# Patient Record
Sex: Female | Born: 1965 | Race: Black or African American | Hispanic: No | Marital: Single | Smoking: Never smoker
Health system: Southern US, Community
[De-identification: ages and names within clinical notes are randomized; demographics above are authoritative.]

## PROBLEM LIST (undated history)

## (undated) DIAGNOSIS — F32A Depression, unspecified: Secondary | ICD-10-CM

## (undated) DIAGNOSIS — F419 Anxiety disorder, unspecified: Secondary | ICD-10-CM

## (undated) DIAGNOSIS — F329 Major depressive disorder, single episode, unspecified: Secondary | ICD-10-CM

## (undated) DIAGNOSIS — K219 Gastro-esophageal reflux disease without esophagitis: Secondary | ICD-10-CM

## (undated) DIAGNOSIS — Z8041 Family history of malignant neoplasm of ovary: Secondary | ICD-10-CM

## (undated) DIAGNOSIS — J45909 Unspecified asthma, uncomplicated: Secondary | ICD-10-CM

## (undated) DIAGNOSIS — I10 Essential (primary) hypertension: Secondary | ICD-10-CM

## (undated) DIAGNOSIS — T7840XA Allergy, unspecified, initial encounter: Secondary | ICD-10-CM

## (undated) DIAGNOSIS — D649 Anemia, unspecified: Secondary | ICD-10-CM

## (undated) HISTORY — DX: Allergy, unspecified, initial encounter: T78.40XA

## (undated) HISTORY — DX: Family history of malignant neoplasm of ovary: Z80.41

## (undated) HISTORY — PX: BREAST SURGERY: SHX581

## (undated) HISTORY — DX: Anemia, unspecified: D64.9

## (undated) HISTORY — DX: Depression, unspecified: F32.A

## (undated) HISTORY — DX: Unspecified asthma, uncomplicated: J45.909

---
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
## 2019-09-22 ENCOUNTER — Emergency Department
Admission: EM | Admit: 2019-09-22 | Discharge: 2019-09-22 | Disposition: A | Discharge status: Home / Self Care | Payer: 59 | Source: Home / Self Care | Attending: Family Medicine | Admitting: Family Medicine

## 2019-09-22 ENCOUNTER — Other Ambulatory Visit: Payer: Self-pay

## 2019-09-22 DIAGNOSIS — I10 Essential (primary) hypertension: Secondary | ICD-10-CM | POA: Diagnosis not present

## 2019-09-22 DIAGNOSIS — K047 Periapical abscess without sinus: Secondary | ICD-10-CM | POA: Diagnosis not present

## 2019-09-22 DIAGNOSIS — Z760 Encounter for issue of repeat prescription: Secondary | ICD-10-CM

## 2019-09-22 HISTORY — DX: Essential (primary) hypertension: I10

## 2019-09-22 LAB — POCT CBC W AUTO DIFF (K'VILLE URGENT CARE)

## 2019-09-22 MED ORDER — CLINDAMYCIN HCL 300 MG PO CAPS: 300.0000 mg | ORAL_CAPSULE | Freq: Four times a day (QID) | ORAL | 0 refills | Status: DC

## 2019-09-22 MED ORDER — HYDROCHLOROTHIAZIDE 25 MG PO TABS: 25.0000 mg | ORAL_TABLET | Freq: Every day | ORAL | 0 refills | Status: DC

## 2019-09-22 MED ORDER — TRAMADOL HCL 50 MG PO TABS: 50.0000 mg | ORAL_TABLET | Freq: Four times a day (QID) | ORAL | 0 refills | Status: DC | PRN

## 2019-09-22 NOTE — Discharge Instructions (Signed)
Please take antibiotics as prescribed and be sure to complete entire course even if you start to feel better to ensure infection does not come back.  Tramadol is strong pain medication. While taking, do not drink alcohol, drive, or perform any other activities that requires focus while taking these medications.   You may take 500mg  acetaminophen every 4-6 hours or in combination with ibuprofen 400-600mg  every 6-8 hours as needed for pain, inflammation, and fever. Be sure you stay well hydrated and you may want to stick with a soft diet until pain starts to improve.  No Primary Care Doctor: Call Health Connect at  [phone] - they can help you locate a primary care doctor that  accepts your insurance, provides certain services, etc. Physician Referral [phone]   Dental Care: Organization         Address                                  Phone                       Notes  [facility] Department of [facility] [place] [phone] Accepts children up to age 37 who are enrolled in [place] or [place]; pregnant women with a Medicaid card; and children who have applied for Medicaid or [place] Health Choice, but were declined, whose parents can pay a reduced fee at time of service.  [facility] Department of [facility]  [address], [place] [phone] Accepts children up to age 38 who are enrolled in [place] or [place]; pregnant women with a Medicaid card; and children who have applied for Medicaid or [place] Health Choice, but were declined, whose parents can pay a reduced fee at time of service.  [place] [facility]  [place] [phone] Patients are seen by appointment only. Walk-ins are not accepted. [place] will see patients 85 years of age and older. Monday - Tuesday (8am-5pm) Most Wednesdays (8:30-5pm) $30 per visit, cash only   [facility] Adult Dental Access PROGRAM  [address], [facility] [phone] Patients are seen by appointment only. Walk-ins are not accepted. [place] will see patients 63 years of age and older. One Wednesday Evening (Monthly: Volunteer Based).  $30 per visit, cash only  [place]  [phone] for adults; Children under age 67, call Graduate Pediatric Dentistry at [phone]. Children aged 44-14, please call [phone] to request a pediatric application.  Dental services are provided in all areas of dental care including fillings, crowns and bridges, complete and partial dentures, implants, gum treatment, root canals, and extractions. Preventive care is also provided. Treatment is provided to both adults and children. Patients are selected via a lottery and there is often a waiting list.   [facility] [address], [place]  [phone] [email or web address]   [facility] [address] [place], [place] [phone], Ext. [phone] Second and Fourth Thursday of each month, opens at 6:30 AM; Clinic ends at 9 AM.  Patients are seen on a first-come first-served basis, and a limited number are seen during each clinic.   [facility]  [address] [place] Danker [place], [place] [phone]   Eligibility Requirements You must have lived in  [place], [place], or [place] counties for at least the last three months.   You cannot be eligible for state or federal sponsored [employer], including [employer], [place], or [employer].   You generally cannot be eligible for healthcare insurance through your employer.    How to apply: Eligibility screenings are held every Tuesday and Wednesday afternoon from 1:00 pm until 4:00 pm. You do not need an appointment for the interview!  [facility] [address], [place], [place]   [place]  [place]  [place]  [phone]

## 2019-09-22 NOTE — ED Triage Notes (Signed)
Woke up Friday with pain in the lower back tooth right side.  Now has facial swelling on that side.

## 2019-09-22 NOTE — ED Provider Notes (Signed)
[facility]    CSN: [id number] Arrival date & time: 09/22/19  1248      History   Chief Complaint Chief Complaint  Patient presents with  . Dental Pain  . Facial Swelling    HPI Tammy Graves is a 53 y.o. female.   HPI Tammy Graves is a 53 y.o. female presenting to UC with c/o 2 days of worsening Right lower dental pain and facial swelling. Hx of poor teeth. She had a filling fall off several years ago but only recently started to have trouble. She called a few dentists but cannot f/u until Oct 27th. Pain is significant, aching and throbbing, no relief with OTC medication. Denies fever, chills, n/v/d.   BP elevated- pt states she has been out of her hydrochlorothiazide for several months. HR also elevated, pt reports hx of same. Denies HA, dizziness or palpitations.   Past Medical History:  Diagnosis Date  . Hypertension     There are no active problems to display for this patient.   History reviewed. No pertinent surgical history.  OB History   No obstetric history on file.      Home Medications    Prior to Admission medications   Medication Sig Start Date End Date Taking? Authorizing Provider  clindamycin (CLEOCIN) 300 MG capsule Take 1 capsule (300 mg total) by mouth 4 (four) times daily. X 7 days 09/22/19   Noe Gens, PA-C  hydrochlorothiazide (HYDRODIURIL) 25 MG tablet Take 1 tablet (25 mg total) by mouth daily. 09/22/19   Noe Gens, PA-C  traMADol (ULTRAM) 50 MG tablet Take 1 tablet (50 mg total) by mouth every 6 (six) hours as needed. 09/22/19   Noe Gens, PA-C    Family History History reviewed. No pertinent family history.  Social History Social History   Tobacco Use  . Smoking status: Never Smoker  . Smokeless tobacco: Never Used  Substance Use Topics  . Alcohol use: Not Currently  . Drug use: Not Currently     Allergies   Other   Review of Systems Review of Systems  Constitutional: Negative for chills and  fever.  HENT: Positive for dental problem and facial swelling. Negative for ear pain and sore throat.   Cardiovascular: Negative for chest pain and palpitations.  Gastrointestinal: Negative for nausea and vomiting.  Neurological: Negative for dizziness and headaches.     Physical Exam Triage Vital Signs ED Triage Vitals  Enc Vitals Group     BP 09/22/19 1305 (!) 186/123     Pulse Rate 09/22/19 1305 (!) 130     Resp 09/22/19 1305 20     Temp 09/22/19 1305 98.2 F (36.8 C)     Temp Source 09/22/19 1305 Temporal     SpO2 09/22/19 1305 99 %     Weight 09/22/19 1307 160 lb (72.6 kg)     Height 09/22/19 1307 5\' 5"  (1.651 m)     Head Circumference --      Peak Flow --      Pain Score 09/22/19 1307 7     Pain Loc --      Pain Edu? --      Excl. in [place]? --    No data found.  Updated Vital Signs BP (!) 167/108   Pulse (!) 123   Temp 98.2 F (36.8 C) (Temporal)   Resp 20   Ht 5\' 5"  (1.651 m)   Wt 160 lb (72.6 kg)   LMP 09/22/2019  SpO2 99%   BMI 26.63 kg/m   Visual Acuity Right Eye Distance:   Left Eye Distance:   Bilateral Distance:    Right Eye Near:   Left Eye Near:    Bilateral Near:     Physical Exam Vitals signs and nursing note reviewed.  Constitutional:      Appearance: Normal appearance. She is well-developed.  HENT:     Head: Normocephalic and atraumatic.     Right Ear: Tympanic membrane normal.     Left Ear: Tympanic membrane normal.     Nose: Nose normal.     Mouth/Throat:     Lips: Pink.     Mouth: Mucous membranes are moist.     Dentition: Abnormal dentition. Dental tenderness, dental caries and dental abscesses present.     Comments: Right lower jaw: mild to moderate edema, tenderness. No erythema or warmth.  Multiple dental caries. Mild edema to gingiva on Right lower gumline. Tenderness. Minimal fluctuance. No bleeding or draianage.  Neck:     Musculoskeletal: Normal range of motion.  Cardiovascular:     Rate and Rhythm: Normal rate and  regular rhythm.  Pulmonary:     Effort: Pulmonary effort is normal. No respiratory distress.     Breath sounds: Normal breath sounds.  Musculoskeletal: Normal range of motion.  Skin:    General: Skin is warm and dry.  Neurological:     Mental Status: She is alert and oriented to person, place, and time.  Psychiatric:        Behavior: Behavior normal.      UC Treatments / Results  Labs (all labs ordered are listed, but only abnormal results are displayed) Labs Reviewed  BASIC METABOLIC PANEL  POCT CBC W AUTO DIFF ([place])    EKG   Radiology No results found.  Procedures Procedures (including critical care time)  Medications Ordered in UC Medications - No data to display  Initial Impression / Assessment and Plan / UC Course  I have reviewed the triage vital signs and the nursing notes.  Pertinent labs & imaging results that were available during my care of the patient were reviewed by me and considered in my medical decision making (see chart for details).    Hx and exam c/w dental abscess Will start on clindamycin Given severity of elevated BP, will restart pt on HCTZ.  BMP to check renal function- pending Pt will be notified if changes in BP medication indicated Resource guide for PCP and dentist provided  Final Clinical Impressions(s) / UC Diagnoses   Final diagnoses:  Dental abscess  Uncontrolled hypertension  Medication refill     Discharge Instructions      Please take antibiotics as prescribed and be sure to complete entire course even if you start to feel better to ensure infection does not come back.  Tramadol is strong pain medication. While taking, do not drink alcohol, drive, or perform any other activities that requires focus while taking these medications.   You may take 500mg  acetaminophen every 4-6 hours or in combination with ibuprofen 400-600mg  every 6-8 hours as needed for pain, inflammation, and fever. Be sure you stay  well hydrated and you may want to stick with a soft diet until pain starts to improve.  No Primary Care Doctor: - Call Health Connect at  [phone] - they can help you locate a primary care doctor that  accepts your insurance, provides certain services, etc. - Physician Referral [phone]   Dental Care:  Organization         Address                                  Phone                       Notes  [facility] Department of [facility] [phone] Accepts children up to age 80 who are enrolled in [place] or [place]; pregnant women with a Medicaid card; and children who have applied for Medicaid or [place] Health Choice, but were declined, whose parents can pay a reduced fee at time of service.  [facility] Department of [facility]  [address], [place] [phone] Accepts children up to age 90 who are enrolled in [place] or [place]; pregnant women with a Medicaid card; and children who have applied for Medicaid or [place] Health Choice, but were declined, whose parents can pay a reduced fee at time of service.  [place] Adult [facility] Access PRO[facility]  [place] [phone] Patients are seen by appointment only. Walk-ins are not accepted. [place] will see patients 77 years of age and older. Monday - Tuesday (8am-5pm) Most Wednesdays (8:30-5pm) $30 per visit, cash only  [facility] Adult Dental Access PROGRAM  [address], [facility] [phone] Patients are seen by appointment only. Walk-ins are not accepted. [place] will see patients 34 years of age and older. One Wednesday Evening (Monthly: Volunteer Based).  $30 per visit, cash only  [place]  [phone] for adults; Children under age 55, call Graduate Pediatric Dentistry at [phone]. Children aged 79-14, please call [phone] to request a  pediatric application.  Dental services are provided in all areas of dental care including fillings, crowns and bridges, complete and partial dentures, implants, gum treatment, root canals, and extractions. Preventive care is also provided. Treatment is provided to both adults and children. Patients are selected via a lottery and there is often a waiting list.   [facility] [place] [address], [place]  [phone] [email or web address]   [facility] [address] [place], [place] [phone], Ext. [phone] Second and Fourth Thursday of each month, opens at 6:30 AM; Clinic ends at 9 AM.  Patients are seen on a first-come first-served basis, and a limited number are seen during each clinic.   [facility]  [address] [place], [place] [phone]   Eligibility Requirements You must have lived in [place], [place], or [place] counties for at least the last three months.   You cannot be eligible for state or federal sponsored [employer], including [employer], [place], or [employer].   You generally cannot be eligible for healthcare insurance through your employer.    How to apply: Eligibility screenings are held every Tuesday and Wednesday afternoon from 1:00 pm until 4:00 pm. You do not need an appointment for the interview!  [facility] [address] [place], [place]   [place]  [place] Department  [place]  [phone]        ED Prescriptions    Medication Sig Dispense Auth. Provider   hydrochlorothiazide (HYDRODIURIL) 25 MG tablet Take 1 tablet (  25 mg total) by mouth daily. 30 tablet Gerarda Fraction, Bunny Kleist O, PA-C   traMADol (ULTRAM) 50 MG tablet Take 1 tablet (50 mg total) by mouth every 6 (six) hours as needed. 15 tablet Gerarda Fraction, Sherby Moncayo O, PA-C   clindamycin (CLEOCIN) 300 MG capsule Take 1 capsule (300  mg total) by mouth 4 (four) times daily. X 7 days 28 capsule Noe Gens, [place]     I have reviewed the PDMP during this encounter.   Noe Gens, [place] 09/22/19 [phone]

## 2019-09-23 LAB — BASIC METABOLIC PANEL
BUN: 13 mg/dL (ref 7–25)
CO2: 25 mmol/L (ref 20–32)
Calcium: 9.5 mg/dL (ref 8.6–10.4)
Chloride: 106 mmol/L (ref 98–110)
Creat: 0.6 mg/dL (ref 0.50–1.05)
Glucose, Bld: 90 mg/dL (ref 65–99)
Potassium: 4 mmol/L (ref 3.5–5.3)
Sodium: 140 mmol/L (ref 135–146)

## 2019-12-12 ENCOUNTER — Encounter: Payer: Self-pay | Admitting: *Deleted

## 2019-12-12 ENCOUNTER — Emergency Department (INDEPENDENT_AMBULATORY_CARE_PROVIDER_SITE_OTHER)
Admission: EM | Admit: 2019-12-12 | Discharge: 2019-12-12 | Disposition: A | Discharge status: Home / Self Care | Payer: Managed Care, Other (non HMO) | Source: Home / Self Care | Attending: Family Medicine | Admitting: Family Medicine

## 2019-12-12 ENCOUNTER — Other Ambulatory Visit: Payer: Self-pay

## 2019-12-12 DIAGNOSIS — I10 Essential (primary) hypertension: Secondary | ICD-10-CM | POA: Diagnosis not present

## 2019-12-12 MED ORDER — HYDROCHLOROTHIAZIDE 25 MG PO TABS: 25.0000 mg | ORAL_TABLET | Freq: Every day | ORAL | 0 refills | Status: DC

## 2019-12-12 NOTE — Discharge Instructions (Addendum)
Please monitor your blood pressure several times weekly, at different times of the day, record on a calendar with times of measurement and take this record to your Family Doctor.

## 2019-12-12 NOTE — ED Triage Notes (Signed)
Pt is here today for a refill for BP med. She is out. She has an appt 12/28/19 with Dr Sheppard Coil to establish care.

## 2019-12-12 NOTE — ED Provider Notes (Signed)
[facility]    CSN: [id number] Arrival date & time: 12/12/19  1008      History   Chief Complaint Chief Complaint  Patient presents with  . Hypertension    HPI Tammy Graves is a 54 y.o. female.   Patient presents requesting refill of HCTZ 25mg  daily.  She states that she has not taken her medication for four weeks.  She has an appointment with Dr. Sheppard Coil on 12/28/19 to establish care.   Record review reveals that patient has been non-compliant with her medication.  She was restarted on HCTZ 25mg  01/04/17 during an office visit with her BP measured at 176/102.  During an office visit 09/22/19 for a dental abscess, she again needed to be restarted on HCTZ 25mg .  Her measured BP at that visit was 167/108l  The history is provided by the patient.    Past Medical History:  Diagnosis Date  . Hypertension     There are no problems to display for this patient.   History reviewed. No pertinent surgical history.  OB History   No obstetric history on file.      Home Medications    Prior to Admission medications   Medication Sig Start Date End Date Taking? Authorizing Provider  aspirin 81 MG chewable tablet Chew by mouth daily.   Yes [provider]  hydrochlorothiazide (HYDRODIURIL) 25 MG tablet Take 1 tablet (25 mg total) by mouth daily. 12/12/19   Kandra Nicolas, MD    Family History Family History  Problem Relation Age of Onset  . Hypertension Father     Social History Social History   Tobacco Use  . Smoking status: Never Smoker  . Smokeless tobacco: Never Used  Substance Use Topics  . Alcohol use: Not Currently  . Drug use: Not Currently     Allergies   Other   Review of Systems Review of Systems  Constitutional: Negative for activity change, chills, diaphoresis, fatigue and fever.  HENT: Negative.   Eyes: Negative.   Respiratory: Negative.   Cardiovascular: Negative.   Gastrointestinal: Negative.   Genitourinary:  Negative.   Musculoskeletal: Negative.   Neurological: Positive for headaches.     Physical Exam Triage Vital Signs ED Triage Vitals  Enc Vitals Group     BP 12/12/19 1029 (!) 221/143     Pulse Rate 12/12/19 1029 (!) 108     Resp 12/12/19 1029 16     Temp 12/12/19 1029 98.6 F (37 C)     Temp Source 12/12/19 1029 Oral     SpO2 12/12/19 1029 96 %     Weight 12/12/19 1030 160 lb (72.6 kg)     Height 12/12/19 1030 5\' 4"  (1.626 m)     Head Circumference --      Peak Flow --      Pain Score 12/12/19 1030 0     Pain Loc --      Pain Edu? --      Excl. in [place]? --    No data found.  Updated Vital Signs BP (!) 221/143 (BP Location: Right Arm)   Pulse (!) 108   Temp 98.6 F (37 C) (Oral)   Resp 16   Ht 5\' 4"  (1.626 m)   Wt 72.6 kg   LMP 11/27/2019   SpO2 96%   BMI 27.46 kg/m   Visual Acuity Right Eye Distance:   Left Eye Distance:   Bilateral Distance:    Right Eye Near:   Left Eye  Near:    Bilateral Near:     Physical Exam Nursing notes and Vital Signs reviewed. Appearance:  Patient appears stated age, and in no acute distress.    Eyes:  Pupils are equal, round, and reactive to light and accomodation.  Extraocular movement is intact.  Conjunctivae are not inflamed   Pharynx:  Normal; moist mucous membranes  Neck:  Supple.  No adenopathy Lungs:  Clear to auscultation.  Breath sounds are equal.  Moving air well. Heart:  Regular rate and rhythm without murmurs, rubs, or gallops.  Abdomen:  Nontender without masses or hepatosplenomegaly.  Bowel sounds are present.  No CVA or flank tenderness.  Extremities:  No edema.  Skin:  No rash present.     UC Treatments / Results  Labs (all labs ordered are listed, but only abnormal results are displayed) Labs Reviewed - No data to display  EKG   Radiology No results found.  Procedures Procedures (including critical care time)  Medications Ordered in UC Medications - No data to display  Initial Impression /  Assessment and Plan / UC Course  I have reviewed the triage vital signs and the nursing notes.  Pertinent labs & imaging results that were available during my care of the patient were reviewed by me and considered in my medical decision making (see chart for details).    Note normal renal function on BMP done 09/22/19. Patient non-compliant.  Resume HCTZ 25mg  daily (will likely need multiple med regimen). Followup with Family Doctor as scheduled for BP management   Final Clinical Impressions(s) / UC Diagnoses   Final diagnoses:  Uncontrolled hypertension     Discharge Instructions     Please monitor your blood pressure several times weekly, at different times of the day, record on a calendar with times of measurement and take this record to your Family Doctor.     ED Prescriptions    Medication Sig Dispense Auth. Provider   hydrochlorothiazide (HYDRODIURIL) 25 MG tablet Take 1 tablet (25 mg total) by mouth daily. 30 tablet Kandra Nicolas, MD        Kandra Nicolas, MD 12/14/19 [phone]

## 2019-12-31 ENCOUNTER — Encounter: Payer: Self-pay | Admitting: Osteopathic Medicine

## 2019-12-31 ENCOUNTER — Ambulatory Visit (INDEPENDENT_AMBULATORY_CARE_PROVIDER_SITE_OTHER): Payer: Managed Care, Other (non HMO) | Admitting: Osteopathic Medicine

## 2019-12-31 ENCOUNTER — Other Ambulatory Visit: Payer: Self-pay

## 2019-12-31 ENCOUNTER — Telehealth: Payer: Self-pay

## 2019-12-31 VITALS — BP 175/114 | HR 105 | Temp 98.1°F | Ht 65.0 in | Wt 157.0 lb

## 2019-12-31 DIAGNOSIS — I10 Essential (primary) hypertension: Secondary | ICD-10-CM | POA: Diagnosis not present

## 2019-12-31 MED ORDER — LISDEXAMFETAMINE DIMESYLATE 60 MG PO CAPS: 60.0000 mg | ORAL_CAPSULE | ORAL | 0 refills | Status: DC

## 2019-12-31 MED ORDER — HYDROCHLOROTHIAZIDE 25 MG PO TABS: 25.0000 mg | ORAL_TABLET | Freq: Every day | ORAL | 0 refills | Status: DC

## 2019-12-31 MED ORDER — VALSARTAN 160 MG PO TABS: ORAL_TABLET | ORAL | 0 refills | Status: DC

## 2019-12-31 MED ORDER — BUPROPION HCL ER (XL) 150 MG PO TB24: 150.0000 mg | ORAL_TABLET | ORAL | 0 refills | Status: DC

## 2019-12-31 NOTE — Telephone Encounter (Signed)
Pt called stating provider was going to send in a refill for the hctz rx to [place]. Pls advise, thanks.

## 2019-12-31 NOTE — Progress Notes (Signed)
Tammy Graves is a 54 y.o. female who presents to  [place] [facility]  today, 12/31/19, seeking care for the following:  The encounter diagnosis was Essential hypertension.  UC visit 12/12/2019 needing refill Rx, Dr Assunta Found did refill HCT 25 mg and advised pt keep f/u. Hx non-adherence to Rx therapy based on record review, BP not at goal.   BP Readings from Last 3 Encounters:  12/31/19 (!) 175/114  12/12/19 (!) 221/143  09/22/19 (!) 167/108     ASSESSMENT & PLAN with other pertinent history/findings:  1. Essential hypertension Will continue HCTZ, add Valsartan, consider combo pill depending how BP looking.       Follow-up instructions: Return in about 2 weeks (around 01/14/2020) for recheck BP w/ Dr A - bring home BP machine with you! Take meds as usual! .      BP (!) 175/114 (BP Location: Left Arm, Patient Position: Sitting, Cuff Size: Normal)   Pulse (!) 105   Temp 98.1 F (36.7 C) (Oral)   Ht 5\' 5"  (1.651 m)   Wt 157 lb 0.6 oz (71.2 kg)   BMI 26.13 kg/m   Current Meds  Medication Sig  . aspirin 81 MG chewable tablet Chew by mouth daily.  . [DISCONTINUED] hydrochlorothiazide (HYDRODIURIL) 25 MG tablet Take 1 tablet (25 mg total) by mouth daily.    No results found for this or any previous visit (from the past 72 hour(s)).  No results found.  Depression screen PHQ 2/9 12/31/2019  Decreased Interest 1  Down, Depressed, Hopeless 1  PHQ - 2 Score 2  Altered sleeping 1  Tired, decreased energy 1  Change in appetite 0  Feeling bad or failure about yourself  1  Trouble concentrating 0  Moving slowly or fidgety/restless 0  Suicidal thoughts 0  PHQ-9 Score 5  Difficult doing work/chores Not difficult at all    GAD 7 : Generalized Anxiety Score 12/31/2019  Nervous, Anxious, on Edge 1  Control/stop worrying 0  Worry too much - different things 1  Trouble relaxing 1  Restless 1  Easily annoyed or irritable 1   Afraid - awful might happen 1  Total GAD 7 Score 6  Anxiety Difficulty Not difficult at all      All questions at time of visit were answered - patient instructed to contact office with any additional concerns or updates.  ER/RTC precautions were reviewed with the patient.  Please note: voice recognition software was used to produce this document, and typos may escape review. Please contact Dr. Sheppard Coil for any needed clarifications.

## 2019-12-31 NOTE — Telephone Encounter (Signed)
Thanks, my mistake, I meant to send this when she was here.  It is sent now!

## 2020-01-01 NOTE — Telephone Encounter (Signed)
Left message advising of the refill.

## 2020-01-16 ENCOUNTER — Ambulatory Visit (INDEPENDENT_AMBULATORY_CARE_PROVIDER_SITE_OTHER): Payer: Managed Care, Other (non HMO) | Admitting: Osteopathic Medicine

## 2020-01-16 ENCOUNTER — Other Ambulatory Visit: Payer: Self-pay

## 2020-01-16 ENCOUNTER — Encounter: Payer: Self-pay | Admitting: Osteopathic Medicine

## 2020-01-16 VITALS — BP 158/94 | HR 101 | Temp 98.1°F | Wt 157.0 lb

## 2020-01-16 DIAGNOSIS — F411 Generalized anxiety disorder: Secondary | ICD-10-CM | POA: Diagnosis not present

## 2020-01-16 DIAGNOSIS — Z8759 Personal history of other complications of pregnancy, childbirth and the puerperium: Secondary | ICD-10-CM | POA: Insufficient documentation

## 2020-01-16 DIAGNOSIS — I10 Essential (primary) hypertension: Secondary | ICD-10-CM

## 2020-01-16 HISTORY — DX: Personal history of other complications of pregnancy, childbirth and the puerperium: Z87.59

## 2020-01-16 HISTORY — DX: Essential (primary) hypertension: I10

## 2020-01-16 MED ORDER — PROPRANOLOL HCL ER 60 MG PO CP24: 60.0000 mg | ORAL_CAPSULE | Freq: Every day | ORAL | 0 refills | Status: DC

## 2020-01-16 MED ORDER — VALSARTAN 160 MG PO TABS: 160.0000 mg | ORAL_TABLET | Freq: Every day | ORAL | 0 refills | Status: DC

## 2020-01-16 NOTE — Progress Notes (Signed)
Tammy Graves is a 54 y.o. female who presents to  [facility]  today, 01/16/20, seeking care for the following:  The primary encounter diagnosis was Essential hypertension. A diagnosis of Anxiety state was also pertinent to this visit.  HCTZ 25 mg daily Valsartan 160 mg daily  BP at home 140-150/90s Home cuff verified in office today - is accurate!  Reports some anxiety issues, mostly while driving    ASSESSMENT & PLAN with other pertinent history/findings:  1. Essential hypertension Adding propranolol to hopefully help w/ anxiety as well If BB not tolerated, will increase valsartan instead  Monitor BP at home Recheck virtually in 1 week   2. Anxiety state Propranolol as above  Consider SSRI depending on response!       No orders of the defined types were placed in this encounter.   Meds ordered this encounter  Medications  . valsartan (DIOVAN) 160 MG tablet    Sig: Take 1 tablet (160 mg total) by mouth daily for 6 days.    Dispense:  90 tablet    Refill:  0  . propranolol ER (INDERAL LA) 60 MG 24 hr capsule    Sig: Take 1 capsule (60 mg total) by mouth daily.    Dispense:  30 capsule    Refill:  0       Follow-up instructions: Return in about 1 week (around 01/23/2020) for VIRTUAL VISIT FOLLOW UP BLOOD PRESSURE .                       BP (!) 158/94 (BP Location: Left Arm, Patient Position: Sitting, Cuff Size: Normal)   Pulse (!) 101   Temp 98.1 F (36.7 C) (Oral)   Wt 157 lb (71.2 kg)   BMI 26.13 kg/m   Current Meds  Medication Sig  . aspirin 81 MG chewable tablet Chew by mouth daily.  . hydrochlorothiazide (HYDRODIURIL) 25 MG tablet Take 1 tablet (25 mg total) by mouth daily.  . valsartan (DIOVAN) 160 MG tablet Take 1 tablet (160 mg total) by mouth daily for 6 days.  . [DISCONTINUED] valsartan (DIOVAN) 160 MG tablet Take 0.5 tablets (80 mg total) by mouth daily for 6 days,  THEN 1 tablet (160 mg total) daily.    No results found for this or any previous visit (from the past 72 hour(s)).  No results found.  Depression screen [place] 2/9 01/16/2020 12/31/2019  Decreased Interest 1 1  Down, Depressed, Hopeless 1 1  PHQ - 2 Score 2 2  Altered sleeping 1 1  Tired, decreased energy 0 1  Change in appetite 0 0  Feeling bad or failure about yourself  2 1  Trouble concentrating 0 0  Moving slowly or fidgety/restless 0 0  Suicidal thoughts 0 0  PHQ-9 Score 5 5  Difficult doing work/chores Not difficult at all Not difficult at all    GAD 7 : Generalized Anxiety Score 01/16/2020 12/31/2019  Nervous, Anxious, on Edge 1 1  Control/stop worrying 1 0  Worry too much - different things 1 1  Trouble relaxing 1 1  Restless 0 1  Easily annoyed or irritable 1 1  Afraid - awful might happen 1 1  Total GAD 7 Score 6 6  Anxiety Difficulty Not difficult at all Not difficult at all      All questions at time of visit were answered - patient instructed to contact office with any additional concerns  or updates.  ER/RTC precautions were reviewed with the patient.  Please note: voice recognition software was used to produce this document, and typos may escape review. Please contact Dr. Sheppard Coil for any needed clarifications.

## 2020-01-21 ENCOUNTER — Telehealth (INDEPENDENT_AMBULATORY_CARE_PROVIDER_SITE_OTHER): Payer: Managed Care, Other (non HMO) | Admitting: Osteopathic Medicine

## 2020-01-21 ENCOUNTER — Encounter: Payer: Self-pay | Admitting: Osteopathic Medicine

## 2020-01-21 VITALS — BP 124/79 | HR 75 | Temp 97.1°F | Wt 156.0 lb

## 2020-01-21 DIAGNOSIS — I10 Essential (primary) hypertension: Secondary | ICD-10-CM

## 2020-01-21 MED ORDER — VALSARTAN 160 MG PO TABS: 160.0000 mg | ORAL_TABLET | Freq: Every day | ORAL | 0 refills | Status: DC

## 2020-01-21 MED ORDER — HYDROCHLOROTHIAZIDE 25 MG PO TABS: 25.0000 mg | ORAL_TABLET | Freq: Every day | ORAL | 0 refills | Status: DC

## 2020-01-21 NOTE — Progress Notes (Signed)
Virtual Visit via Phone  I connected with      Ren…E Lye on 01/21/20 at 10:45 AM  by a telemedicine application and verified that I am speaking with the correct person using two identifiers.  Patient is at home  I am in office   I discussed the limitations of evaluation and management by telemedicine and the availability of in person appointments. The patient expressed understanding and agreed to proceed.  History of Present Illness: Tammy Graves is a 54 y.o. female who would like to discuss follow up HTN   Started the propranolol last Friday (about 4 days ago)  Doing well in terms of BP goals but feeling dizzy Changing meds to nighttime helped this but still an issue No orthostatic symptoms, seem to be random  Anxiety about the same     Observations/Objective: BP 124/79   Pulse 75   Temp (!) 97.1 F (36.2 C) (Oral)   Wt 156 lb (70.8 kg)   LMP 12/17/2019   BMI 25.96 kg/m  BP Readings from Last 3 Encounters:  01/21/20 124/79  01/16/20 (!) 158/94  12/31/19 (!) 175/114   Exam: Normal Speech.  NAD  Lab and Radiology Results No results found for this or any previous visit (from the past 72 hour(s)). No results found.     Assessment and Plan: 54 y.o. female with The encounter diagnosis was Essential hypertension.   PDMP not reviewed this encounter. No orders of the defined types were placed in this encounter.  Meds ordered this encounter  Medications  . hydrochlorothiazide (HYDRODIURIL) 25 MG tablet    Sig: Take 1 tablet (25 mg total) by mouth daily.    Dispense:  90 tablet    Refill:  0  . valsartan (DIOVAN) 160 MG tablet    Sig: Take 1 tablet (160 mg total) by mouth daily.    Dispense:  90 tablet    Refill:  0   Patient Instructions  Let's try taking medications in the evenings If this helps dizziness and keeps blood pressure at goal (130/80 or less) then nothing else to do! If the dizziness is no better, let's STOP the propranolol and  DOUBLE the valsartan 160 mg to 2 tablets (320 mg) daily   Let me know how BP is looking with these changes! I've set reminders for MyChart to alert you and you can reply to those messages directly with updates! Please reach out to me sooner if any questions or other concerns!     Instructions sent via MyChart. If MyChart not available, pt was given option for info via personal e-mail w/ no guarantee of protected health info over unsecured e-mail communication, and MyChart sign-up instructions were sent to patient.   Follow Up Instructions: Return for MyChart - will update [place] on BP and meds .    I discussed the assessment and treatment plan with the patient. The patient was provided an opportunity to ask questions and all were answered. The patient agreed with the plan and demonstrated an understanding of the instructions.   The patient was advised to call back or seek an in-person evaluation if any new concerns, if symptoms worsen or if the condition fails to improve as anticipated.  25 minutes of non-face-to-face time was provided during this encounter.      . . . . . . . . . . . . . Marland Kitchen  Historical information moved to improve visibility of documentation.  Past Medical History:  Diagnosis Date  . Depression   . Essential hypertension 01/16/2020  . History of pre-eclampsia 01/16/2020  . Hypertension    No past surgical history on file. Social History   Tobacco Use  . Smoking status: Never Smoker  . Smokeless tobacco: Never Used  Substance Use Topics  . Alcohol use: Not Currently   family history includes Hypertension in her father; Ovarian cancer in her cousin.  Medications: Current Outpatient Medications  Medication Sig Dispense Refill  . aspirin 81 MG chewable tablet Chew by mouth daily.    . hydrochlorothiazide (HYDRODIURIL) 25 MG tablet Take 1 tablet (25 mg total) by mouth daily. 90 tablet 0  . propranolol ER (INDERAL LA)  60 MG 24 hr capsule Take 1 capsule (60 mg total) by mouth daily. 30 capsule 0  . valsartan (DIOVAN) 160 MG tablet Take 1 tablet (160 mg total) by mouth daily. 90 tablet 0   No current facility-administered medications for this visit.   Allergies  Allergen Reactions  . Codeine Nausea And Vomiting  . Other     tomatoes and OJ

## 2020-01-21 NOTE — Progress Notes (Signed)
Contacted pt at 10 am, no answer. Left a vm msg for pt.

## 2020-01-22 ENCOUNTER — Encounter: Payer: Self-pay | Admitting: Osteopathic Medicine

## 2020-01-22 NOTE — Patient Instructions (Addendum)
Let's try taking medications in the evenings If this helps dizziness and keeps blood pressure at goal (130/80 or less) then nothing else to do! If the dizziness is no better, let's STOP the propranolol and DOUBLE the valsartan 160 mg to 2 tablets (320 mg) daily   Let me know how BP is looking with these changes! I've set reminders for MyChart to alert you and you can reply to those messages directly with updates! Please reach out to me sooner if any questions or other concerns!

## 2020-01-28 ENCOUNTER — Encounter: Payer: Self-pay | Admitting: Osteopathic Medicine

## 2020-02-04 ENCOUNTER — Encounter: Payer: Self-pay | Admitting: Osteopathic Medicine

## 2020-02-05 MED ORDER — VALSARTAN-HYDROCHLOROTHIAZIDE 320-25 MG PO TABS: 1.0000 | ORAL_TABLET | Freq: Every day | ORAL | 1 refills | Status: DC

## 2020-02-08 ENCOUNTER — Other Ambulatory Visit: Payer: Self-pay | Admitting: Osteopathic Medicine

## 2020-05-19 ENCOUNTER — Encounter: Payer: Self-pay | Admitting: Osteopathic Medicine

## 2020-05-19 ENCOUNTER — Telehealth (INDEPENDENT_AMBULATORY_CARE_PROVIDER_SITE_OTHER): Payer: 59 | Admitting: Osteopathic Medicine

## 2020-05-19 VITALS — BP 129/87 | Wt 159.0 lb

## 2020-05-19 DIAGNOSIS — H9202 Otalgia, left ear: Secondary | ICD-10-CM

## 2020-05-19 MED ORDER — PREDNISONE 20 MG PO TABS: 20.0000 mg | ORAL_TABLET | Freq: Two times a day (BID) | ORAL | 0 refills | Status: DC

## 2020-05-19 MED ORDER — IPRATROPIUM BROMIDE 0.06 % NA SOLN: 2.0000 | Freq: Four times a day (QID) | NASAL | 1 refills | Status: DC

## 2020-05-19 MED ORDER — AMOXICILLIN-POT CLAVULANATE 875-125 MG PO TABS: 1.0000 | ORAL_TABLET | Freq: Two times a day (BID) | ORAL | 0 refills | Status: DC

## 2020-05-19 NOTE — Patient Instructions (Addendum)
Prescriptions ordered this encounter  Medications  . predniSONE (DELTASONE) 20 MG tablet = STEROIDS - start now    Sig: Take 1 tablet (20 mg total) by mouth 2 (two) times daily with a meal.    Dispense:  10 tablet    Refill:  0  . ipratropium (ATROVENT) 0.06 % nasal spray = NASAL SPRAY - start now     Sig: Place 2 sprays into both nostrils 4 (four) times daily. As needed for sinus congestion or ear pressure    Dispense:  15 mL    Refill:  1  . amoxicillin-clavulanate (AUGMENTIN) 875-125 MG tablet = ANTIBIOTICS - start if the above measures are not helping in 2 days, or if symptoms get worse     Sig: Take 1 tablet by mouth 2 (two) times daily.    Dispense:  14 tablet    Refill:  0   OTC medications to start now ANTIHISTAMINE - Claritin, Allegra, Zyrtec or similar DECONGESTANT - Sudafed (may increase blood pressure a bit, if it goes higher than 140/90 please stop this medication)    Return if symptoms worsen or fail to improve.and are not helped by antibiotics   Call / message if any other questions or concerns!

## 2020-05-19 NOTE — Progress Notes (Signed)
Attempted to call patient at 7:15 am. Left message on Vm.

## 2020-05-19 NOTE — Progress Notes (Signed)
Virtual Visit via Video (App used: MyChart) Note  I connected with      Tammy Graves on 05/19/20 at 7:31 AM  by a telemedicine application and verified that I am speaking with the correct person using two identifiers.  Patient is in the car I am in office   I discussed the limitations of evaluation and management by telemedicine and the availability of in person appointments. The patient expressed understanding and agreed to proceed.  History of Present Illness: Tammy Graves is a 54 y.o. female who would like to discuss ear pain   Ear pain x4 days on left, sore then worse, went to pharmacy and tried OTC earache drops. Worse at night. No sinus pain/pressure, no lymphadenopathy, no hearing change, no headache, no jaw or dental pain      Observations/Objective: BP 129/87   Wt 159 lb (72.1 kg)   BMI 26.46 kg/m  BP Readings from Last 3 Encounters:  05/19/20 129/87  01/21/20 124/79  01/16/20 (!) 158/94   Exam: Normal Speech.  NAD  Lab and Radiology Results No results found for this or any previous visit (from the past 72 hour(s)). No results found.     Assessment and Plan: 54 y.o. female with The encounter diagnosis was Otalgia of left ear.   PDMP not reviewed this encounter. No orders of the defined types were placed in this encounter.  Patient Instructions   Prescriptions ordered this encounter  Medications  . predniSONE (DELTASONE) 20 MG tablet = STEROIDS - start now    Sig: Take 1 tablet (20 mg total) by mouth 2 (two) times daily with a meal.    Dispense:  10 tablet    Refill:  0  . ipratropium (ATROVENT) 0.06 % nasal spray = NASAL SPRAY - start now     Sig: Place 2 sprays into both nostrils 4 (four) times daily. As needed for sinus congestion or ear pressure    Dispense:  15 mL    Refill:  1  . amoxicillin-clavulanate (AUGMENTIN) 875-125 MG tablet = ANTIBIOTICS - start if the above measures are not helping in 2 days, or if symptoms get worse      Sig: Take 1 tablet by mouth 2 (two) times daily.    Dispense:  14 tablet    Refill:  0   OTC medications to start now ANTIHISTAMINE - Claritin, Allegra, Zyrtec or similar DECONGESTANT - Sudafed (may increase blood pressure a bit, if it goes higher than 140/90 please stop this medication)    Return if symptoms worsen or fail to improve.and are not helped by antibiotics   Call / message if any other questions or concerns!       Instructions sent via MyChart. If MyChart not available, pt was given option for info via personal e-mail w/ no guarantee of protected health info over unsecured e-mail communication, and MyChart sign-up instructions were sent to patient.   Follow Up Instructions: Return if symptoms worsen or fail to improve.    I discussed the assessment and treatment plan with the patient. The patient was provided an opportunity to ask questions and all were answered. The patient agreed with the plan and demonstrated an understanding of the instructions.   The patient was advised to call back or seek an in-person evaluation if any new concerns, if symptoms worsen or if the condition fails to improve as anticipated.  20 minutes of non-face-to-face time was provided during this encounter.      . . . . . . . . . . . . . Marland Kitchen  Historical information moved to improve visibility of documentation.  Past Medical History:  Diagnosis Date  . Depression   . Essential hypertension 01/16/2020  . History of pre-eclampsia 01/16/2020  . Hypertension    No past surgical history on file. Social History   Tobacco Use  . Smoking status: Never Smoker  . Smokeless tobacco: Never Used  Substance Use Topics  . Alcohol use: Not Currently   family history includes Hypertension in her father; Ovarian cancer in her cousin.  Medications: Current Outpatient Medications  Medication Sig Dispense Refill  . aspirin 81 MG chewable tablet Chew by  mouth daily.    . valsartan-hydrochlorothiazide (DIOVAN-HCT) 320-25 MG tablet Take 1 tablet by mouth daily. 90 tablet 1  . amoxicillin-clavulanate (AUGMENTIN) 875-125 MG tablet Take 1 tablet by mouth 2 (two) times daily. 14 tablet 0  . ipratropium (ATROVENT) 0.06 % nasal spray Place 2 sprays into both nostrils 4 (four) times daily. As needed for sinus congestion or ear pressure 15 mL 1  . predniSONE (DELTASONE) 20 MG tablet Take 1 tablet (20 mg total) by mouth 2 (two) times daily with a meal. 10 tablet 0   No current facility-administered medications for this visit.   Allergies  Allergen Reactions  . Codeine Nausea And Vomiting  . Other     tomatoes and OJ

## 2020-06-11 ENCOUNTER — Other Ambulatory Visit: Payer: Self-pay | Admitting: Osteopathic Medicine

## 2020-07-30 ENCOUNTER — Other Ambulatory Visit: Payer: Self-pay | Admitting: Osteopathic Medicine

## 2020-08-03 ENCOUNTER — Encounter: Payer: Self-pay | Admitting: Osteopathic Medicine

## 2020-08-24 ENCOUNTER — Other Ambulatory Visit: Payer: Self-pay | Admitting: Osteopathic Medicine

## 2020-08-24 DIAGNOSIS — Z1231 Encounter for screening mammogram for malignant neoplasm of breast: Secondary | ICD-10-CM

## 2020-08-27 ENCOUNTER — Ambulatory Visit (INDEPENDENT_AMBULATORY_CARE_PROVIDER_SITE_OTHER): Payer: 59

## 2020-08-27 ENCOUNTER — Other Ambulatory Visit: Payer: Self-pay

## 2020-08-27 DIAGNOSIS — Z1231 Encounter for screening mammogram for malignant neoplasm of breast: Secondary | ICD-10-CM | POA: Diagnosis not present

## 2020-09-01 ENCOUNTER — Other Ambulatory Visit: Payer: Self-pay | Admitting: Osteopathic Medicine

## 2020-09-01 DIAGNOSIS — R928 Other abnormal and inconclusive findings on diagnostic imaging of breast: Secondary | ICD-10-CM

## 2020-09-10 ENCOUNTER — Other Ambulatory Visit: Payer: Self-pay

## 2020-09-10 ENCOUNTER — Ambulatory Visit
Admission: RE | Admit: 2020-09-10 | Discharge: 2020-09-10 | Disposition: A | Discharge status: Home / Self Care | Payer: 59 | Source: Ambulatory Visit | Attending: Osteopathic Medicine | Admitting: Osteopathic Medicine

## 2020-09-10 ENCOUNTER — Other Ambulatory Visit: Payer: Self-pay | Admitting: Osteopathic Medicine

## 2020-09-10 ENCOUNTER — Other Ambulatory Visit: Payer: 59

## 2020-09-10 DIAGNOSIS — R928 Other abnormal and inconclusive findings on diagnostic imaging of breast: Secondary | ICD-10-CM

## 2020-09-10 DIAGNOSIS — R921 Mammographic calcification found on diagnostic imaging of breast: Secondary | ICD-10-CM

## 2020-09-22 ENCOUNTER — Ambulatory Visit
Admission: RE | Admit: 2020-09-22 | Discharge: 2020-09-22 | Disposition: A | Discharge status: Home / Self Care | Payer: 59 | Source: Ambulatory Visit | Attending: Osteopathic Medicine | Admitting: Osteopathic Medicine

## 2020-09-22 ENCOUNTER — Other Ambulatory Visit: Payer: Self-pay

## 2020-09-22 DIAGNOSIS — R921 Mammographic calcification found on diagnostic imaging of breast: Secondary | ICD-10-CM

## 2020-09-24 ENCOUNTER — Encounter: Payer: Self-pay | Admitting: *Deleted

## 2020-09-24 DIAGNOSIS — D0512 Intraductal carcinoma in situ of left breast: Secondary | ICD-10-CM | POA: Insufficient documentation

## 2020-09-25 ENCOUNTER — Telehealth: Payer: Self-pay | Admitting: Oncology

## 2020-09-25 NOTE — Telephone Encounter (Signed)
Spoke to patient to confirm afternoon [facility] appointment for 10/27, explained surgeon's office will call, sent packet via e-mail to [email or web address]

## 2020-09-29 NOTE — Progress Notes (Signed)
[facility]  [phone] [phone]     ID: Tammy Graves DOB: 1966-07-03  MR#: [id number]  QZR#:[id number]  Patient Care Team: Emeterio Reeve, DO as PCP - General (Osteopathic Medicine) Mauro Kaufmann, RN as Oncology Nurse Navigator Rockwell Germany, RN as Oncology Nurse Navigator Jovita Kussmaul, MD as Consulting Physician (General Surgery) Kelly Eisler, Virgie Dad, MD as Consulting Physician (Oncology) Gery Pray, MD as Consulting Physician (Radiation Oncology) Chauncey Cruel, MD OTHER MD:  CHIEF COMPLAINT: Estrogen receptor positive DCIS  CURRENT TREATMENT: Awaiting definitive surgery   HISTORY OF CURRENT ILLNESS: Tammy Graves had her first routine screening mammography on 08/27/2020 showing a possible abnormality in the right breast. She underwent bilateral diagnostic mammography with tomography and right breast ultrasonography at [facility] on 09/10/2020 showing: breast density category C; suspicious 1.1 cm left breast calcifications; no suspicious masses or abnormalities in the right breast.  Accordingly on 09/22/2020 she proceeded to biopsy of the left breast area in question. The pathology from this procedure ([id number]) showed: ductal carcinoma in situ, high grade, partially involving a sclerotic lesion. Prognostic indicators significant for: estrogen receptor, 90% positive with strong staining intensity and progesterone receptor, 30% positive with moderate staining intensity.   The patient's subsequent history is as detailed below.   INTERVAL HISTORY: Tammy Graves was evaluated in the multidisciplinary breast cancer clinic on 09/30/2020 accompanied by her son Tammy Graves. Her case was also presented at the multidisciplinary breast cancer conference on the same day. At that time a preliminary plan was proposed: Breast conserving surgery with nipple preservation if possible, adjuvant radiation, antiestrogens, and genetics testing   REVIEW  OF SYSTEMS: There were no specific symptoms leading to the original mammogram, which was routinely scheduled. On the provided questionnaire, Tammy Graves reports night sweats, loss of sleep, wearing glasses, anxiety, and depression. The patient denies unusual headaches, visual changes, nausea, vomiting, stiff neck, dizziness, or gait imbalance. There has been no cough, phlegm production, or pleurisy, no chest pain or pressure, and no change in bowel or bladder habits. The patient denies fever, rash, bleeding, or unexplained weight loss. A detailed review of systems was otherwise entirely negative.   PAST MEDICAL HISTORY: Past Medical History:  Diagnosis Date  . Depression   . Essential hypertension 01/16/2020  . History of pre-eclampsia 01/16/2020  . Hypertension     PAST SURGICAL HISTORY: No past surgical history on file.   FAMILY HISTORY: Family History  Problem Relation Age of Onset  . Hypertension Father   . Ovarian cancer Cousin    Her father is age 17, as of September 29, 2020. Her mother died at age 50 from heart failure (postpartum). Tammy Graves has one full brother. She also has a half-brother and a half-sister. She reports stomach cancer in a maternal cousin at age 60 and ovarian cancer in her maternal grandmother at age 15.   GYNECOLOGIC HISTORY:  No LMP recorded. Menarche: unsure Age at first live birth: 55 years old Tammy Graves P 1 LMP 09/08/2020, some irregularity Contraceptive: never used HRT n/a  Hysterectomy? no BSO? 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 She describes herself as single. She lives at home with her son Tammy Graves, age 3, who is the Mudlogger of student affairs and Architectural technologist at [employer]. She attends [employer].    ADVANCED DIRECTIVES: not in place; at the 09/30/2020 visit the patient was given the appropriate documents to complete  and notarized at her discretion   HEALTH  MAINTENANCE: Social History   Tobacco Use  . Smoking status: Never Smoker  . Smokeless tobacco: Never Used  Vaping Use  . Vaping Use: Never used  Substance Use Topics  . Alcohol use: Not Currently  . Drug use: Not Currently     Colonoscopy: never done  PAP: date unsure  Bone density: never done   Allergies  Allergen Reactions  . Codeine Nausea And Vomiting  . Other     tomatoes and OJ    Current Outpatient Medications  Medication Sig Dispense Refill  . aspirin 81 MG chewable tablet Chew by mouth daily.    . valsartan-hydrochlorothiazide (DIOVAN-HCT) 320-25 MG tablet Take 1 tablet by mouth daily. appt for refills 90 tablet 0   No current facility-administered medications for this visit.    OBJECTIVE: African-American woman who appears younger than stated age  19:   09/30/20 1309  BP: (!) 155/98  Pulse: 95  Resp: 17  Temp: 98.2 F (36.8 C)  SpO2: 99%     Body mass index is 26.13 kg/m.   Wt Readings from Last 3 Encounters:  09/30/20 157 lb (71.2 kg)  05/19/20 159 lb (72.1 kg)  01/21/20 156 lb (70.8 kg)      ECOG FS:1 - Symptomatic but completely ambulatory  Ocular: Sclerae unicteric, pupils round and equal Ear-nose-throat: Wearing a mask Lymphatic: No cervical or supraclavicular adenopathy Lungs no rales or rhonchi Heart regular rate and rhythm Abd soft, nontender, positive bowel sounds MSK no focal spinal tenderness, no joint edema Neuro: non-focal, well-oriented, appropriate affect Breasts: The right breast is status post recent biopsy.  There is a small ecchymosis.  There is no palpable mass.  Left breast is benign.  Both axillae are benign.   LAB RESULTS:  CMP     Component Value Date/Time   NA 140 09/30/2020 1213   K 3.5 09/30/2020 1213   CL 105 09/30/2020 1213   CO2 27 09/30/2020 1213   GLUCOSE 90 09/30/2020 1213   BUN 16 09/30/2020 1213   CREATININE 0.75 09/30/2020 1213   CREATININE 0.60 09/22/2019 1351   CALCIUM 9.8 09/30/2020 1213    PROT 7.4 09/30/2020 1213   ALBUMIN 4.1 09/30/2020 1213   AST 19 09/30/2020 1213   ALT 23 09/30/2020 1213   ALKPHOS 85 09/30/2020 1213   BILITOT 0.9 09/30/2020 1213   GFRNONAA >60 09/30/2020 1213    No results found for: TOTALPROTELP, ALBUMINELP, A1GS, A2GS, BETS, BETA2SER, GAMS, MSPIKE, SPEI  Lab Results  Component Value Date   WBC 4.4 09/30/2020   NEUTROABS 2.3 09/30/2020   HGB 12.8 09/30/2020   HCT 37.6 09/30/2020   MCV 90.6 09/30/2020   PLT 231 09/30/2020    No results found for: LABCA2  No components found for: DSKAJG811  No results for input(s): INR in the last 168 hours.  No results found for: LABCA2  No results found for: XBW620  No results found for: BTD974  No results found for: BUL845  No results found for: CA2729  No components found for: HGQUANT  No results found for: CEA1 / No results found for: CEA1   No results found for: AFPTUMOR  No results found for: CHROMOGRNA  No results found for: KPAFRELGTCHN, LAMBDASER, KAPLAMBRATIO (kappa/lambda light chains)  No results found for: HGBA, HGBA2QUANT, HGBFQUANT, HGBSQUAN (Hemoglobinopathy evaluation)   No results found for: LDH  No results found for: IRON, TIBC, IRONPCTSAT (Iron and TIBC)  No results found for:  FERRITIN  Urinalysis No results found for: COLORURINE, APPEARANCEUR, LABSPEC, PHURINE, GLUCOSEU, HGBUR, BILIRUBINUR, KETONESUR, PROTEINUR, UROBILINOGEN, NITRITE, LEUKOCYTESUR   STUDIES: US BREAST LTD UNI RIGHT INC AXILLA  Result Date: 09/10/2020 CLINICAL DATA:  Screening recall for possible right breast asymmetry and possible left breast asymmetry with associated calcifications. EXAM: DIGITAL DIAGNOSTIC BILATERAL MAMMOGRAM WITH TOMO AND CAD; ULTRASOUND RIGHT BREAST LIMITED COMPARISON:  Screening mammogram dated 08/27/2020. ACR Breast Density Category c: The breast tissue is heterogeneously dense, which may obscure small masses. FINDINGS: Additional tomograms were performed of the right  breast. The initially questioned possible right breast asymmetry is felt to resolve on the additional imaging, possibly related to dense fibroglandular tissue. Spot compression magnification views of retroareolar left breast demonstrate heterogeneous calcifications spanning 1.1 cm. Mammographic images were processed with CAD. Targeted ultrasound of the right breast was performed. No suspicious masses or abnormality seen, only small scattered areas of fibrocystic change identified. The entire upper/central right breast was scanned. IMPRESSION: Suspicious 1.1 cm group of left breast calcifications. RECOMMENDATION: Recommend stereotactic guided biopsy of the calcifications within the retroareolar/central left breast. This will be scheduled for the patient. I have discussed the findings and recommendations with the patient. If applicable, a reminder letter will be sent to the patient regarding the next appointment. BI-RADS CATEGORY  4: Suspicious. Electronically Signed   By: Everlean Alstrom M.D.   On: 09/10/2020 15:46   MM DIAG BREAST TOMO BILATERAL  Result Date: 09/10/2020 CLINICAL DATA:  Screening recall for possible right breast asymmetry and possible left breast asymmetry with associated calcifications. EXAM: DIGITAL DIAGNOSTIC BILATERAL MAMMOGRAM WITH TOMO AND CAD; ULTRASOUND RIGHT BREAST LIMITED COMPARISON:  Screening mammogram dated 08/27/2020. ACR Breast Density Category c: The breast tissue is heterogeneously dense, which may obscure small masses. FINDINGS: Additional tomograms were performed of the right breast. The initially questioned possible right breast asymmetry is felt to resolve on the additional imaging, possibly related to dense fibroglandular tissue. Spot compression magnification views of retroareolar left breast demonstrate heterogeneous calcifications spanning 1.1 cm. Mammographic images were processed with CAD. Targeted ultrasound of the right breast was performed. No suspicious masses or  abnormality seen, only small scattered areas of fibrocystic change identified. The entire upper/central right breast was scanned. IMPRESSION: Suspicious 1.1 cm group of left breast calcifications. RECOMMENDATION: Recommend stereotactic guided biopsy of the calcifications within the retroareolar/central left breast. This will be scheduled for the patient. I have discussed the findings and recommendations with the patient. If applicable, a reminder letter will be sent to the patient regarding the next appointment. BI-RADS CATEGORY  4: Suspicious. Electronically Signed   By: Everlean Alstrom M.D.   On: 09/10/2020 15:46   MM CLIP PLACEMENT LEFT  Result Date: 09/22/2020 CLINICAL DATA:  Biopsy of left breast calcifications EXAM: DIAGNOSTIC LEFT MAMMOGRAM POST STEREOTACTIC BIOPSY COMPARISON:  Previous exam(s). FINDINGS: Mammographic images were obtained following stereotactic guided biopsy of left breast calcifications. The biopsy marking clip is in expected position at the site of biopsy. IMPRESSION: Appropriate positioning of the X shaped biopsy marking clip at the site of biopsy in the region of the biopsied left breast calcifications. Final Assessment: Post Procedure Mammograms for Marker Placement Electronically Signed   By: Dorise Bullion III M.D   On: 09/22/2020 09:19   MM LT BREAST BX W LOC DEV 1ST LESION IMAGE BX SPEC STEREO GUIDE  Addendum Date: 09/27/2020   ADDENDUM REPORT: 09/24/2020 12:26 ADDENDUM: Pathology revealed HIGH GRADE DUCTAL CARCINOMA IN SITU WITH CALCIFICATIONS, partially involving a sclerotic lesion  of the Left breast, retroareolar. This was found to be concordant by Dr. Dorise Bullion. Pathology results were discussed with the patient by telephone. The patient reported doing well after the biopsy with tenderness at the site. Post biopsy instructions and care were reviewed and questions were answered. The patient was encouraged to call [facility] for any  additional concerns. My direct phone number was provided. The patient was referred to [facility] on September 30, 2020. Consideration for a bilateral breast MRI for further evaluation of extent of disease given the high grade histology and heterogeneously dense breasts. Pathology results reported by Tammy Purser, RN on 09/24/2020. Electronically Signed   By: Dorise Bullion III M.D   On: 09/24/2020 12:26   Result Date: 09/27/2020 CLINICAL DATA:  Biopsy of left breast calcifications EXAM: LEFT BREAST STEREOTACTIC CORE NEEDLE BIOPSY COMPARISON:  Previous exams. FINDINGS: The patient and I discussed the procedure of stereotactic-guided biopsy including benefits and alternatives. We discussed the high likelihood of a successful procedure. We discussed the risks of the procedure including infection, bleeding, tissue injury, clip migration, and inadequate sampling. Informed written consent was given. The usual time out protocol was performed immediately prior to the procedure. Using sterile technique and 1% Lidocaine as local anesthetic, under stereotactic guidance, a 9 gauge vacuum assisted device was used to perform core needle biopsy of calcifications in the retroareolar region using a superior approach. Specimen radiograph was performed showing calcifications in both core specimens. Specimens with calcifications are identified for pathology. Lesion quadrant: Superior At the conclusion of the procedure, X shaped tissue marker clip was deployed into the biopsy cavity. Follow-up 2-view mammogram was performed and dictated separately. IMPRESSION: Stereotactic-guided biopsy of left breast calcifications. No apparent complications. Electronically Signed: By: Dorise Bullion III M.D On: 09/22/2020 09:16     ELIGIBLE FOR AVAILABLE RESEARCH PROTOCOL: AET  ASSESSMENT: 54 y.o. Tammy Graves woman status post left breast biopsy 09/22/2020 for  ductal carcinoma in situ, grade 3, estrogen and progesterone receptor positive.  (1) genetics testing  (2) definitive surgery pending  (3) adjuvant radiation  (4) antiestrogens to start the completion of local treatment  PLAN: I met today with Tammy Graves to review her new diagnosis. Specifically we discussed the biology of her breast cancer, its diagnosis, staging, treatment  options and prognosis.Lashawne understands that in noninvasive ductal carcinoma, also called ductal carcinoma in situ ("DCIS") the breast cancer cells remain trapped in the ducts were they started. They cannot travel to a vital organ. For that reason these cancers in themselves are not life-threatening.  If the whole breast is removed then all the ducts are removed and since the cancer cells are trapped in the ducts, the cure rate with mastectomy for noninvasive breast cancer is approximately 99%. Nevertheless we recommend lumpectomy, because there is no survival advantage to mastectomy and because the cosmetic result is generally superior with breast conservation.  Since the patient is keeping her breasts, there will be some risk of recurrence. The recurrence can only be in the same breast since, again, the cells are trapped in the ducts. There is no connection from one breast to the other. The risk of local recurrence is cut by more than half with radiation, which is standard in this situation.  In estrogen receptor positive cancers like Tammy Graves, anti-estrogens can also be considered. They will further reduce the risk of recurrence by one half. In addition anti-estrogens will lower the  risk of a new breast cancer developing in either breast, also by one half. That risk otherwise approaches 1% per year.   Accordingly the overall plan is for surgery, followed by radiation, then a discussion of anti-estrogens.  , Also qualifies for genetics testing. In patients who carry a deleterious mutation [for example in a  BRCA gene], the  risk of a new breast cancer developing in the future may be sufficiently great that the patient may choose bilateral mastectomies. However if she wishes to keep her breasts in that situation it is safe to do so. That would require intensified screening, which generally means not only yearly mammography but a yearly breast MRI as well.   Krystan has a good understanding of the overall plan. She agrees with it. She knows the goal of treatment in her case is cure. She will call with any problems that may develop before her next visit here.  Total encounter time 55 minutes.Sarajane Jews C. Audrielle Vankuren, MD 09/30/2020 2:19 PM Medical Oncology and Hematology [facility] [place], [place] [address] Tel. [phone]    Fax. [phone]   This document serves as a record of services personally performed by Lurline Del, MD. It was created on his behalf by Wilburn Mylar, a trained medical scribe. The creation of this record is based on the scribe's personal observations and the provider's statements to them.   I, Lurline Del MD, have reviewed the above documentation for accuracy and completeness, and I agree with the above.    *Total Encounter Time as defined by the Centers for Medicare and Medicaid Services includes, in addition to the face-to-face time of a patient visit (documented in the note above) non-face-to-face time: obtaining and reviewing outside history, ordering and reviewing medications, tests or procedures, care coordination (communications with other health care professionals or caregivers) and documentation in the medical record.

## 2020-09-30 ENCOUNTER — Ambulatory Visit
Admission: RE | Admit: 2020-09-30 | Discharge: 2020-09-30 | Disposition: A | Discharge status: Home / Self Care | Payer: 59 | Source: Ambulatory Visit | Attending: Radiation Oncology | Admitting: Radiation Oncology

## 2020-09-30 ENCOUNTER — Ambulatory Visit: Payer: 59 | Admitting: Physical Therapy

## 2020-09-30 ENCOUNTER — Ambulatory Visit: Payer: 59 | Admitting: Genetic Counselor

## 2020-09-30 ENCOUNTER — Other Ambulatory Visit: Payer: Self-pay

## 2020-09-30 ENCOUNTER — Encounter: Payer: Self-pay | Admitting: Genetic Counselor

## 2020-09-30 ENCOUNTER — Inpatient Hospital Stay: Payer: 59

## 2020-09-30 ENCOUNTER — Encounter: Payer: Self-pay | Admitting: *Deleted

## 2020-09-30 ENCOUNTER — Ambulatory Visit: Payer: Self-pay | Admitting: General Surgery

## 2020-09-30 ENCOUNTER — Inpatient Hospital Stay: Payer: 59 | Attending: Oncology | Admitting: Oncology

## 2020-09-30 VITALS — BP 155/98 | HR 95 | Temp 98.2°F | Resp 17 | Ht 65.0 in | Wt 157.0 lb

## 2020-09-30 DIAGNOSIS — Z79899 Other long term (current) drug therapy: Secondary | ICD-10-CM | POA: Diagnosis not present

## 2020-09-30 DIAGNOSIS — Z800 Family history of malignant neoplasm of digestive organs: Secondary | ICD-10-CM | POA: Diagnosis not present

## 2020-09-30 DIAGNOSIS — Z8041 Family history of malignant neoplasm of ovary: Secondary | ICD-10-CM | POA: Insufficient documentation

## 2020-09-30 DIAGNOSIS — D0511 Intraductal carcinoma in situ of right breast: Secondary | ICD-10-CM | POA: Insufficient documentation

## 2020-09-30 DIAGNOSIS — Z7982 Long term (current) use of aspirin: Secondary | ICD-10-CM | POA: Diagnosis not present

## 2020-09-30 DIAGNOSIS — Z170 Estrogen receptor positive status [ER+]: Secondary | ICD-10-CM | POA: Insufficient documentation

## 2020-09-30 DIAGNOSIS — I10 Essential (primary) hypertension: Secondary | ICD-10-CM | POA: Insufficient documentation

## 2020-09-30 DIAGNOSIS — D0512 Intraductal carcinoma in situ of left breast: Secondary | ICD-10-CM

## 2020-09-30 DIAGNOSIS — F329 Major depressive disorder, single episode, unspecified: Secondary | ICD-10-CM | POA: Diagnosis not present

## 2020-09-30 LAB — CMP (CANCER CENTER ONLY)
ALT: 23 U/L (ref 0–44)
AST: 19 U/L (ref 15–41)
Albumin: 4.1 g/dL (ref 3.5–5.0)
Alkaline Phosphatase: 85 U/L (ref 38–126)
Anion gap: 8 (ref 5–15)
BUN: 16 mg/dL (ref 6–20)
CO2: 27 mmol/L (ref 22–32)
Calcium: 9.8 mg/dL (ref 8.9–10.3)
Chloride: 105 mmol/L (ref 98–111)
Creatinine: 0.75 mg/dL (ref 0.44–1.00)
GFR, Estimated: >60 mL/min (ref >60)
Glucose, Bld: 90 mg/dL (ref 70–99)
Potassium: 3.5 mmol/L (ref 3.5–5.1)
Sodium: 140 mmol/L (ref 135–145)
Total Bilirubin: 0.9 mg/dL (ref 0.3–1.2)
Total Protein: 7.4 g/dL (ref 6.5–8.1)

## 2020-09-30 LAB — CBC WITH DIFFERENTIAL (CANCER CENTER ONLY)
Abs Immature Granulocytes: 0.01 10*3/uL (ref 0.00–0.07)
Basophils Absolute: 0 10*3/uL (ref 0.0–0.1)
Basophils Relative: 1 %
Eosinophils Absolute: 0.2 10*3/uL (ref 0.0–0.5)
Eosinophils Relative: 4 %
HCT: 37.6 % (ref 36.0–46.0)
Hemoglobin: 12.8 g/dL (ref 12.0–15.0)
Immature Granulocytes: 0 %
Lymphocytes Relative: 34 %
Lymphs Abs: 1.5 10*3/uL (ref 0.7–4.0)
MCH: 30.8 pg (ref 26.0–34.0)
MCHC: 34 g/dL (ref 30.0–36.0)
MCV: 90.6 fL (ref 80.0–100.0)
Monocytes Absolute: 0.4 10*3/uL (ref 0.1–1.0)
Monocytes Relative: 9 %
Neutro Abs: 2.3 10*3/uL (ref 1.7–7.7)
Neutrophils Relative %: 52 %
Platelet Count: 231 10*3/uL (ref 150–400)
RBC: 4.15 MIL/uL (ref 3.87–5.11)
RDW: 12 % (ref 11.5–15.5)
WBC Count: 4.4 10*3/uL (ref 4.0–10.5)
nRBC: 0 % (ref 0.0–0.2)

## 2020-09-30 LAB — GENETIC SCREENING ORDER

## 2020-09-30 NOTE — Progress Notes (Signed)
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 Ductal carcinoma in situ (DCIS) of left breast  D05.12     HISTORY OF PRESENT ILLNESS::Tammy Graves is a 54 y.o. female who is presenting to the office today for evaluation of her newly diagnosed breast cancer. She is accompanied by her son. She is doing well overall.   She had routine screening mammography on 08/27/2020 that showed an asymmetry in the right breast in addition to an asymmetry with calcifications in the left breast. She underwent bilateral diagnostic mammography with tomography and right breast ultrasonography at [facility] on 09/10/2020 that showed a suspicious 1.1 cm group of left breast calcifications.  Biopsy on 09/22/2020 showed high-grade ductal carcinoma in situ. Prognostic indicators significant for: estrogen receptor, 90% positive with a strong staining intensity and progesterone receptor, 30% positive with a moderate staining intensity.  Menarche: Unsure Age at first live birth: 54 years old GP: 1 LMP: 09/08/2020 Contraceptive: None HRT: None   The patient was referred today for presentation in the multidisciplinary conference.  Radiology studies and pathology slides were presented there for review and discussion of treatment options.  A consensus was discussed regarding potential next steps.  PREVIOUS RADIATION THERAPY: No  PAST MEDICAL HISTORY:  Past Medical History:  Diagnosis Date  . Depression   . Essential hypertension 01/16/2020  . Family history of ovarian cancer   .  History of pre-eclampsia 01/16/2020  . Hypertension     PAST SURGICAL HISTORY:No past surgical history on file.  FAMILY HISTORY:  Family History  Problem Relation Age of Onset  . Hypertension Father   . Ovarian cancer Cousin   . Ovarian cancer Maternal Grandmother 86    SOCIAL HISTORY:  Social History   Socioeconomic History  . Marital status: Single    Spouse name: Not on file  . Number of children: Not on file  . Years of education: Not on file  . Highest education level: Not on file  Occupational History  . Occupation: Product manager: [employer]  Tobacco Use  . Smoking status: Never Smoker  . Smokeless tobacco: Never Used  Vaping Use  . Vaping Use: Never used  Substance and Sexual Activity  . Alcohol use: Not Currently  . Drug use: Not Currently  . Sexual activity: Not Currently    Partners: Male  Other Topics Concern  . Not on file  Social History Narrative  . Not on file   Social Determinants of Health   Financial Resource Strain:   . Difficulty of Paying Living Expenses: Not on file  Food Insecurity:   . Worried About Charity fundraiser in the Last Year: Not on file  . Ran Out of Food in the Last Year: Not on file  Transportation Needs:   . Lack of Transportation (Medical): Not on file  . Lack of Transportation (Non-Medical): Not on file  Physical Activity:   . Days of Exercise per Week: Not on file  . Minutes of Exercise per Session: Not on file  Stress:   .  Feeling of Stress : Not on file  Social Connections:   . Frequency of Communication with Friends and Family: Not on file  . Frequency of Social Gatherings with Friends and Family: Not on file  . Attends Religious Services: Not on file  . Active Member of Clubs or Organizations: Not on file  . Attends Archivist Meetings: Not on file  . Marital Status: Not on file    ALLERGIES:  Allergies  Allergen Reactions  . Codeine Nausea And Vomiting  . Other     tomatoes and OJ      MEDICATIONS:  Current Outpatient Medications  Medication Sig Dispense Refill  . aspirin 81 MG chewable tablet Chew by mouth daily.    . valsartan-hydrochlorothiazide (DIOVAN-HCT) 320-25 MG tablet Take 1 tablet by mouth daily. appt for refills 90 tablet 0   No current facility-administered medications for this encounter.    REVIEW OF SYSTEMS: A 10+ POINT REVIEW OF SYSTEMS WAS OBTAINED including neurology, dermatology, psychiatry, cardiac, respiratory, lymph, extremities, GI, GU, musculoskeletal, constitutional, reproductive, HEENT. On the provided form, she reports night sweats, loss of sleep, wearing glasses, anxiety, and depression. She denies chest pain, shortness of breath, cough, dysuria, nausea, vomiting, diarrhea, abdominal pain, rash, and any other symptoms.    PHYSICAL EXAM:   Vitals with BMI 09/30/2020  Height 5\' 5"   Weight 157 lbs  BMI 19.50  Systolic 932  Diastolic 98  Pulse 95   Lungs are clear to auscultation bilaterally. Heart has regular rate and rhythm. No palpable cervical, supraclavicular, or axillary adenopathy. Abdomen soft, non-tender, normal bowel sounds. Right breast with no palpable mass, nipple discharge, or bleeding.  Left breast with a biopsy site at the 12 o'clock position with some induration/associated bruising. No nipple discharge or bleeding.   KPS = 100  100 - Normal; no complaints; no evidence of disease. 90   - Able to carry on normal activity; minor signs or symptoms of disease. 80   - Normal activity with effort; some signs or symptoms of disease. 40   - Cares for self; unable to carry on normal activity or to do active work. 60   - Requires occasional assistance, but is able to care for most of his personal needs. 50   - Requires considerable assistance and frequent medical care. 33   - Disabled; requires special care and assistance. 6   - Severely disabled; hospital admission is indicated although death not imminent. 34   - Very sick;  hospital admission necessary; active supportive treatment necessary. 10   - Moribund; fatal processes progressing rapidly. 0     - Dead  Karnofsky DA, [place], Craver LS and Burchenal [facility] [phone]) The use of the nitrogen mustards in the palliative treatment of carcinoma: with particular reference to bronchogenic carcinoma Cancer 1 634-56  LABORATORY DATA:  Lab Results  Component Value Date   WBC 4.4 09/30/2020   HGB 12.8 09/30/2020   HCT 37.6 09/30/2020   MCV 90.6 09/30/2020   PLT 231 09/30/2020   Lab Results  Component Value Date   NA 140 09/30/2020   K 3.5 09/30/2020   CL 105 09/30/2020   CO2 27 09/30/2020   Lab Results  Component Value Date   ALT 23 09/30/2020   AST 19 09/30/2020   ALKPHOS 85 09/30/2020   BILITOT 0.9 09/30/2020    PULMONARY FUNCTION TEST:   Recent Review Flowsheet Data   There is no flowsheet data to display.     RADIOGRAPHY:  US BREAST LTD UNI RIGHT INC AXILLA  Result Date: 09/10/2020 CLINICAL DATA:  Screening recall for possible right breast asymmetry and possible left breast asymmetry with associated calcifications. EXAM: DIGITAL DIAGNOSTIC BILATERAL MAMMOGRAM WITH TOMO AND CAD; ULTRASOUND RIGHT BREAST LIMITED COMPARISON:  Screening mammogram dated 08/27/2020. ACR Breast Density Category c: The breast tissue is heterogeneously dense, which may obscure small masses. FINDINGS: Additional tomograms were performed of the right breast. The initially questioned possible right breast asymmetry is felt to resolve on the additional imaging, possibly related to dense fibroglandular tissue. Spot compression magnification views of retroareolar left breast demonstrate heterogeneous calcifications spanning 1.1 cm. Mammographic images were processed with CAD. Targeted ultrasound of the right breast was performed. No suspicious masses or abnormality seen, only small scattered areas of fibrocystic change identified. The entire upper/central right breast was scanned.  IMPRESSION: Suspicious 1.1 cm group of left breast calcifications. RECOMMENDATION: Recommend stereotactic guided biopsy of the calcifications within the retroareolar/central left breast. This will be scheduled for the patient. I have discussed the findings and recommendations with the patient. If applicable, a reminder letter will be sent to the patient regarding the next appointment. BI-RADS CATEGORY  4: Suspicious. Electronically Signed   By: Everlean Alstrom M.D.   On: 09/10/2020 15:46   MM DIAG BREAST TOMO BILATERAL  Result Date: 09/10/2020 CLINICAL DATA:  Screening recall for possible right breast asymmetry and possible left breast asymmetry with associated calcifications. EXAM: DIGITAL DIAGNOSTIC BILATERAL MAMMOGRAM WITH TOMO AND CAD; ULTRASOUND RIGHT BREAST LIMITED COMPARISON:  Screening mammogram dated 08/27/2020. ACR Breast Density Category c: The breast tissue is heterogeneously dense, which may obscure small masses. FINDINGS: Additional tomograms were performed of the right breast. The initially questioned possible right breast asymmetry is felt to resolve on the additional imaging, possibly related to dense fibroglandular tissue. Spot compression magnification views of retroareolar left breast demonstrate heterogeneous calcifications spanning 1.1 cm. Mammographic images were processed with CAD. Targeted ultrasound of the right breast was performed. No suspicious masses or abnormality seen, only small scattered areas of fibrocystic change identified. The entire upper/central right breast was scanned. IMPRESSION: Suspicious 1.1 cm group of left breast calcifications. RECOMMENDATION: Recommend stereotactic guided biopsy of the calcifications within the retroareolar/central left breast. This will be scheduled for the patient. I have discussed the findings and recommendations with the patient. If applicable, a reminder letter will be sent to the patient regarding the next appointment. BI-RADS CATEGORY  4:  Suspicious. Electronically Signed   By: Everlean Alstrom M.D.   On: 09/10/2020 15:46   MM CLIP PLACEMENT LEFT  Result Date: 09/22/2020 CLINICAL DATA:  Biopsy of left breast calcifications EXAM: DIAGNOSTIC LEFT MAMMOGRAM POST STEREOTACTIC BIOPSY COMPARISON:  Previous exam(s). FINDINGS: Mammographic images were obtained following stereotactic guided biopsy of left breast calcifications. The biopsy marking clip is in expected position at the site of biopsy. IMPRESSION: Appropriate positioning of the X shaped biopsy marking clip at the site of biopsy in the region of the biopsied left breast calcifications. Final Assessment: Post Procedure Mammograms for Marker Placement Electronically Signed   By: Dorise Bullion III M.D   On: 09/22/2020 09:19   MM LT BREAST BX W LOC DEV 1ST LESION IMAGE BX SPEC STEREO GUIDE  Addendum Date: 09/27/2020   ADDENDUM REPORT: 09/24/2020 12:26 ADDENDUM: Pathology revealed HIGH GRADE DUCTAL CARCINOMA IN SITU WITH CALCIFICATIONS, partially involving a sclerotic lesion of the Left breast, retroareolar. This was found to be concordant by Dr. Dorise Bullion. Pathology results were discussed with the patient  by telephone. The patient reported doing well after the biopsy with tenderness at the site. Post biopsy instructions and care were reviewed and questions were answered. The patient was encouraged to call [facility] for any additional concerns. My direct phone number was provided. The patient was referred to [facility] on September 30, 2020. Consideration for a bilateral breast MRI for further evaluation of extent of disease given the high grade histology and heterogeneously dense breasts. Pathology results reported by Terie Purser, RN on 09/24/2020. Electronically Signed   By: Dorise Bullion III M.D   On: 09/24/2020 12:26   Result Date: 09/27/2020 CLINICAL DATA:  Biopsy of left  breast calcifications EXAM: LEFT BREAST STEREOTACTIC CORE NEEDLE BIOPSY COMPARISON:  Previous exams. FINDINGS: The patient and I discussed the procedure of stereotactic-guided biopsy including benefits and alternatives. We discussed the high likelihood of a successful procedure. We discussed the risks of the procedure including infection, bleeding, tissue injury, clip migration, and inadequate sampling. Informed written consent was given. The usual time out protocol was performed immediately prior to the procedure. Using sterile technique and 1% Lidocaine as local anesthetic, under stereotactic guidance, a 9 gauge vacuum assisted device was used to perform core needle biopsy of calcifications in the retroareolar region using a superior approach. Specimen radiograph was performed showing calcifications in both core specimens. Specimens with calcifications are identified for pathology. Lesion quadrant: Superior At the conclusion of the procedure, X shaped tissue marker clip was deployed into the biopsy cavity. Follow-up 2-view mammogram was performed and dictated separately. IMPRESSION: Stereotactic-guided biopsy of left breast calcifications. No apparent complications. Electronically Signed: By: Dorise Bullion III M.D On: 09/22/2020 09:16      IMPRESSION: Stage 0 Left Breast, DCIS, ER+ / PR+, Grade 3   The patient will be a good candidate for breast conservation with radiotherapy to the left breast. We discussed the general course of radiation, potential side effects, and toxicities with radiation and the patient is interested in this approach. Of note, the patient is claustrophobic but does feel that she would be able to participate in radiation therapy treatment.   PLAN:  1. Genetics 2. Left breast lumpectomy 3. Adjuvant radiation therapy 4. Aromatase inhibitor   ------------------------------------------------  Blair Promise, PhD, MD  This document serves as a record of services personally  performed by Gery Pray, MD. It was created on his behalf by Clerance Lav, a trained medical scribe. The creation of this record is based on the scribe's personal observations and the provider's statements to them. This document has been checked and approved by the attending provider.

## 2020-09-30 NOTE — Progress Notes (Unsigned)
Met with Tammy Graves in [facility] to introduce Tammy Graves team/resources, reviewing distress screen per protocol.  The patient scored a 9 on the Psychosocial Distress Thermometer which indicates Severe distress. Also assessed for distress and other psychosocial needs. Pt was accompanied by her adult son, who is her primary support.  Tammy Graves indicated that her information concerns have been resolved since meeting with her doctors, but she still feels very overwhelmed.  Several times over the course of our visit, her eyes were teary. She stated she finds some hope in her faith and has been using prayer to help her cope. Chaplain commended her good use of her tools and also normalized that sometimes in the midst of crisis individuals may find it more difficult to find comfort in things that worked previously.  Some individuals have faith backgrounds that don't allow much space for doubt or worry. Chaplain encouraged Tammy Graves to consider multiple passages in the Bible that demonstrate faithful people feeling worried and afraid and even angry with God.  Tammy Graves shared that she has had not had an appetite and is having trouble eating. She is also waking up worried and staying awake for hours. She reports a small amount of relief after her visit, but still endorses significant distress.  Chaplain encouraged her to monitor those symptoms and let her provider know if they continue much longer.  At this time, pt does not want a referral for a peer mentor, but may consider one of the support groups.  She would also welcome continued follow up from social work and spiritual care.    Follow up needed: yes        [address] [place], [facility]  Pager [phone]  Voicemail [phone]

## 2020-09-30 NOTE — Progress Notes (Signed)
REFERRING PROVIDER: Chauncey Cruel, MD [address] [place],  [place] [address]  PRIMARY PROVIDER:  Emeterio Reeve, DO  PRIMARY REASON FOR VISIT:  1. Ductal carcinoma in situ (DCIS) of left breast   2. Family history of ovarian cancer      I connected with [name] on 09/30/2020 at 3:20pm EDT by video conference and verified that I am speaking with the correct person using two identifiers.   Patient location: [facility] clinic Provider location: [facility] office  HISTORY OF PRESENT ILLNESS:   [name], a 54 y.o. female, was seen for a [place] cancer genetics consultation at the request of Dr. Jana Hakim due to a personal and family history of cancer.  [name] presents to clinic today to discuss the possibility of a hereditary predisposition to cancer, genetic testing, and to further clarify her future cancer risks, as well as potential cancer risks for family members.   In 2021, at the age of 69, [name] was diagnosed with ductal carcinoma in situ, ER+/PR+, of the left breast. The treatment plan includes surgery, radiation therapy, and antiestrogen therapy.    CANCER HISTORY:  Oncology History   No history exists.    RISK FACTORS:  Menarche: unsure.  First live birth at age 4.  OCP use for approximately 0 years.  Ovaries intact: yes.  Hysterectomy: no.  Menopausal status: premenopausal.  HRT use: 0 years. Colonoscopy: no; not examined. Mammogram within the last year: yes.   Past Medical History:  Diagnosis Date  . Depression   . Essential hypertension 01/16/2020  . Family history of ovarian cancer   . History of pre-eclampsia 01/16/2020  . Hypertension     No past surgical history on file.  Social History   Socioeconomic History  . Marital status: Single    Spouse name: Not on file  . Number of children: Not on file  . Years of education: Not on file  . Highest education level: Not on file  Occupational History  . Occupation: Associate Professor: [employer]  Tobacco Use  . Smoking status: Never Smoker  . Smokeless tobacco: Never Used  Vaping Use  . Vaping Use: Never used  Substance and Sexual Activity  . Alcohol use: Not Currently  . Drug use: Not Currently  . Sexual activity: Not Currently    Partners: Male  Other Topics Concern  . Not on file  Social History Narrative  . Not on file   Social Determinants of Health   Financial Resource Strain:   . Difficulty of Paying Living Expenses: Not on file  Food Insecurity:   . Worried About Charity fundraiser in the Last Year: Not on file  . Ran Out of Food in the Last Year: Not on file  Transportation Needs:   . Lack of Transportation (Medical): Not on file  . Lack of Transportation (Non-Medical): Not on file  Physical Activity:   . Days of Exercise per Week: Not on file  . Minutes of Exercise per Session: Not on file  Stress:   . Feeling of Stress : Not on file  Social Connections:   . Frequency of Communication with Friends and Family: Not on file  . Frequency of Social Gatherings with Friends and Family: Not on file  . Attends Religious Services: Not on file  . Active Member of Clubs or Organizations: Not on file  . Attends Archivist Meetings: Not on file  . Marital Status: Not on  file     FAMILY HISTORY:  We obtained a detailed, 4-generation family history.  Significant diagnoses are listed below: Family History  Problem Relation Age of Onset  . Hypertension Father   . Ovarian cancer Cousin   . Ovarian cancer Maternal Grandmother 6   [name] has one son (age 81). She has one full-brother (age 62), a paternal half-sister (age 61), and a paternal half-brother (age 60). None of these family members have had cancer.  [name] mother died at the age of 11 and did not have cancer. Her mother had a paternal half-sister and a paternal half-brother, neither of whom had cancer. [name] maternal grandmother died at the age of 72  from ovarian cancer, and her maternal grandfather died older than 5.   [name] father is 14 and has not had cancer. She has one paternal aunt and one paternal uncle. Her paternal grandparents died older than 22. There are no known diagnoses of cancer on the paternal side of the family.  [name] is unaware of previous family history of genetic testing for hereditary cancer risks. Patient's ancestors are of unkonwn descent. There is no reported Ashkenazi Jewish ancestry. There is no known consanguinity.  GENETIC COUNSELING ASSESSMENT: [name] is a 54 y.o. female with a personal history of breast cancer and a family history of ovarian cancer, which is somewhat suggestive of a hereditary cancer syndrome and predisposition to cancer. We, therefore, discussed and recommended the following at today's visit.   DISCUSSION: Approximately 5-10% of breast cancer is hereditary, with most cases associated with the BRCA1 and BRCA2 genes. There are other genes that can be associated with hereditary breast cancer syndromes. These include ATM, CHEK2, PALB2, etc. We discussed that testing is beneficial for several reasons, including knowing about other cancer risks, identifying potential screening and risk-reduction options that may be appropriate, and to understand if other family members could be at risk for cancer and allow them to undergo genetic testing.  We reviewed the characteristics, features and inheritance patterns of hereditary cancer syndromes. We also discussed genetic testing. We discussed the implications of a negative vs a positive result. We recommended [name] pursue genetic testing for a hereditary cancer gene panel that includes known breast and ovarian cancer genes.   Based on [name]'s personal and family history of cancer, she meets medical criteria for genetic testing. Despite that she meets criteria, there may still be an out of pocket cost.   PLAN:  [name] did not  wish to pursue genetic testing at today's visit. We understand this decision and remain available to coordinate genetic testing at any time in the future. We, therefore, recommend [name] continue to follow the cancer screening guidelines given by her primary healthcare provider.  [name] questions were answered to her satisfaction today. Our contact information was provided should additional questions or concerns arise. Thank you for the referral and allowing [place] to share in the care of your patient.   Clint Guy, [place], [facility] Licensed, Certified Dispensing [email or web address] Phone: [phone]  The patient was seen for a total of 12 minutes in face-to-face genetic counseling.  This patient was discussed with Drs. Magrinat, Lindi Adie and/or Burr Medico who agrees with the above.    _______________________________________________________________________ For Office Staff:  Number of people involved in session: 1 Was an Intern/ student involved with case: no

## 2020-10-05 DIAGNOSIS — C801 Malignant (primary) neoplasm, unspecified: Secondary | ICD-10-CM

## 2020-10-05 HISTORY — DX: Malignant (primary) neoplasm, unspecified: C80.1

## 2020-10-08 ENCOUNTER — Telehealth: Payer: Self-pay | Admitting: *Deleted

## 2020-10-08 ENCOUNTER — Encounter: Payer: Self-pay | Admitting: *Deleted

## 2020-10-08 NOTE — Telephone Encounter (Signed)
Left message for a return phone call to follow up from [facility] last week and assess navigation needs.

## 2020-10-15 ENCOUNTER — Encounter: Payer: Self-pay | Admitting: *Deleted

## 2020-10-19 ENCOUNTER — Encounter: Payer: Self-pay | Admitting: *Deleted

## 2020-10-20 ENCOUNTER — Encounter: Payer: Self-pay | Admitting: *Deleted

## 2020-10-20 ENCOUNTER — Other Ambulatory Visit: Payer: Self-pay | Admitting: General Surgery

## 2020-10-20 DIAGNOSIS — D0512 Intraductal carcinoma in situ of left breast: Secondary | ICD-10-CM

## 2020-10-23 ENCOUNTER — Other Ambulatory Visit: Payer: Self-pay | Admitting: Osteopathic Medicine

## 2020-10-26 ENCOUNTER — Encounter: Payer: Self-pay | Admitting: *Deleted

## 2020-10-27 NOTE — Progress Notes (Signed)
Nutrition  Patient identified by attending Breast Clinic on 09/30/20.  Patient was given nutrition packet with RD contact information by nurse navigator.    Chart reviewed.   54 year old female with new diagnosis of DCIS.  Planning surgery, radiation and antiestrogens.    Ht: 65 inches Wt: 157 lb BMI: 26  Patient currently not at nutritional risk.  Please consult RD if changes in nutritional status occur.  Nalea Salce B. Zenia Resides, [place], [place] Registered Dietitian [phone] (mobile)

## 2020-11-02 ENCOUNTER — Encounter (HOSPITAL_BASED_OUTPATIENT_CLINIC_OR_DEPARTMENT_OTHER): Payer: Self-pay | Admitting: General Surgery

## 2020-11-02 ENCOUNTER — Other Ambulatory Visit: Payer: Self-pay

## 2020-11-02 ENCOUNTER — Telehealth: Payer: Self-pay | Admitting: Oncology

## 2020-11-02 ENCOUNTER — Encounter: Payer: Self-pay | Admitting: *Deleted

## 2020-11-02 NOTE — Telephone Encounter (Signed)
R/s appt per 11/29 sch msg - left message for pt with appt date and time

## 2020-11-03 ENCOUNTER — Other Ambulatory Visit (HOSPITAL_COMMUNITY)
Admission: RE | Admit: 2020-11-03 | Discharge: 2020-11-03 | Disposition: A | Discharge status: Home / Self Care | Payer: 59 | Source: Ambulatory Visit | Attending: General Surgery | Admitting: General Surgery

## 2020-11-03 ENCOUNTER — Other Ambulatory Visit: Payer: Self-pay | Admitting: Osteopathic Medicine

## 2020-11-03 ENCOUNTER — Encounter (HOSPITAL_BASED_OUTPATIENT_CLINIC_OR_DEPARTMENT_OTHER)
Admission: RE | Admit: 2020-11-03 | Discharge: 2020-11-03 | Disposition: A | Discharge status: Home / Self Care | Payer: 59 | Source: Ambulatory Visit | Attending: General Surgery | Admitting: General Surgery

## 2020-11-03 DIAGNOSIS — D0512 Intraductal carcinoma in situ of left breast: Secondary | ICD-10-CM | POA: Diagnosis not present

## 2020-11-03 DIAGNOSIS — Z01812 Encounter for preprocedural laboratory examination: Secondary | ICD-10-CM | POA: Insufficient documentation

## 2020-11-03 DIAGNOSIS — Z20822 Contact with and (suspected) exposure to covid-19: Secondary | ICD-10-CM | POA: Diagnosis not present

## 2020-11-03 LAB — BASIC METABOLIC PANEL
Anion gap: 11 (ref 5–15)
BUN: 14 mg/dL (ref 6–20)
CO2: 26 mmol/L (ref 22–32)
Calcium: 10 mg/dL (ref 8.9–10.3)
Chloride: 100 mmol/L (ref 98–111)
Creatinine, Ser: 0.68 mg/dL (ref 0.44–1.00)
GFR, Estimated: >60 mL/min (ref >60)
Glucose, Bld: 102 mg/dL — ABNORMAL HIGH (ref 70–99)
Potassium: 3.5 mmol/L (ref 3.5–5.1)
Sodium: 137 mmol/L (ref 135–145)

## 2020-11-03 LAB — SARS CORONAVIRUS 2 (TAT 6-24 HRS): SARS Coronavirus 2: NEGATIVE

## 2020-11-03 NOTE — Progress Notes (Signed)

## 2020-11-05 ENCOUNTER — Ambulatory Visit
Admission: RE | Admit: 2020-11-05 | Discharge: 2020-11-05 | Disposition: A | Discharge status: Home / Self Care | Payer: 59 | Source: Ambulatory Visit | Attending: General Surgery | Admitting: General Surgery

## 2020-11-05 ENCOUNTER — Other Ambulatory Visit: Payer: Self-pay

## 2020-11-05 DIAGNOSIS — D0512 Intraductal carcinoma in situ of left breast: Secondary | ICD-10-CM

## 2020-11-06 ENCOUNTER — Ambulatory Visit (HOSPITAL_BASED_OUTPATIENT_CLINIC_OR_DEPARTMENT_OTHER): Payer: 59 | Admitting: Anesthesiology

## 2020-11-06 ENCOUNTER — Ambulatory Visit
Admission: RE | Admit: 2020-11-06 | Discharge: 2020-11-06 | Disposition: A | Discharge status: Home / Self Care | Payer: 59 | Source: Ambulatory Visit | Attending: General Surgery | Admitting: General Surgery

## 2020-11-06 ENCOUNTER — Encounter (HOSPITAL_BASED_OUTPATIENT_CLINIC_OR_DEPARTMENT_OTHER)
Admission: RE | Disposition: A | Discharge status: Home / Self Care | Payer: Self-pay | Source: Home / Self Care | Attending: General Surgery

## 2020-11-06 ENCOUNTER — Ambulatory Visit (HOSPITAL_BASED_OUTPATIENT_CLINIC_OR_DEPARTMENT_OTHER)
Admission: RE | Admit: 2020-11-06 | Discharge: 2020-11-06 | Disposition: A | Discharge status: Home / Self Care | Payer: 59 | Attending: General Surgery | Admitting: General Surgery

## 2020-11-06 ENCOUNTER — Other Ambulatory Visit: Payer: Self-pay

## 2020-11-06 ENCOUNTER — Encounter (HOSPITAL_BASED_OUTPATIENT_CLINIC_OR_DEPARTMENT_OTHER): Payer: Self-pay | Admitting: General Surgery

## 2020-11-06 DIAGNOSIS — D0512 Intraductal carcinoma in situ of left breast: Secondary | ICD-10-CM

## 2020-11-06 HISTORY — PX: BREAST LUMPECTOMY WITH RADIOACTIVE SEED LOCALIZATION: SHX6424

## 2020-11-06 HISTORY — DX: Anxiety disorder, unspecified: F41.9

## 2020-11-06 HISTORY — DX: Gastro-esophageal reflux disease without esophagitis: K21.9

## 2020-11-06 LAB — POCT PREGNANCY, URINE: Preg Test, Ur: NEGATIVE

## 2020-11-06 SURGERY — BREAST LUMPECTOMY WITH RADIOACTIVE SEED LOCALIZATION
Anesthesia: General | Site: Breast | Laterality: Left

## 2020-11-06 MED ORDER — ACETAMINOPHEN 500 MG PO TABS
ORAL_TABLET | ORAL | Status: AC
  Filled 2020-11-06: qty 2

## 2020-11-06 MED ORDER — FENTANYL CITRATE (PF) 100 MCG/2ML IJ SOLN
INTRAMUSCULAR | Status: DC | PRN
  Administered 2020-11-06: 100 ug via INTRAVENOUS

## 2020-11-06 MED ORDER — MIDAZOLAM HCL 5 MG/5ML IJ SOLN
INTRAMUSCULAR | Status: DC | PRN
  Administered 2020-11-06: 2 mg via INTRAVENOUS

## 2020-11-06 MED ORDER — PHENYLEPHRINE 40 MCG/ML (10ML) SYRINGE FOR IV PUSH (FOR BLOOD PRESSURE SUPPORT)
PREFILLED_SYRINGE | INTRAVENOUS | Status: AC
  Filled 2020-11-06: qty 10

## 2020-11-06 MED ORDER — CEFAZOLIN SODIUM-DEXTROSE 2-4 GM/100ML-% IV SOLN
2.0000 g | INTRAVENOUS | Status: AC
  Administered 2020-11-06: 2 g via INTRAVENOUS

## 2020-11-06 MED ORDER — ACETAMINOPHEN 500 MG PO TABS
1000.0000 mg | ORAL_TABLET | ORAL | Status: AC
  Administered 2020-11-06: 1000 mg via ORAL

## 2020-11-06 MED ORDER — FENTANYL CITRATE (PF) 100 MCG/2ML IJ SOLN
INTRAMUSCULAR | Status: AC
  Filled 2020-11-06: qty 2

## 2020-11-06 MED ORDER — EPHEDRINE 5 MG/ML INJ
INTRAVENOUS | Status: AC
  Filled 2020-11-06: qty 10

## 2020-11-06 MED ORDER — BUPIVACAINE-EPINEPHRINE (PF) 0.25% -1:200000 IJ SOLN
INTRAMUSCULAR | Status: DC | PRN
  Administered 2020-11-06: 20 mL

## 2020-11-06 MED ORDER — FENTANYL CITRATE (PF) 100 MCG/2ML IJ SOLN: 25.0000 ug | INTRAMUSCULAR | Status: DC | PRN

## 2020-11-06 MED ORDER — LIDOCAINE 2% (20 MG/ML) 5 ML SYRINGE
INTRAMUSCULAR | Status: AC
  Filled 2020-11-06: qty 20

## 2020-11-06 MED ORDER — LACTATED RINGERS IV SOLN: INTRAVENOUS | Status: DC

## 2020-11-06 MED ORDER — CHLORHEXIDINE GLUCONATE CLOTH 2 % EX PADS: 6.0000 | MEDICATED_PAD | Freq: Once | CUTANEOUS | Status: DC

## 2020-11-06 MED ORDER — CELECOXIB 200 MG PO CAPS
200.0000 mg | ORAL_CAPSULE | ORAL | Status: AC
  Administered 2020-11-06: 200 mg via ORAL

## 2020-11-06 MED ORDER — PHENYLEPHRINE 40 MCG/ML (10ML) SYRINGE FOR IV PUSH (FOR BLOOD PRESSURE SUPPORT)
PREFILLED_SYRINGE | INTRAVENOUS | Status: AC
  Filled 2020-11-06: qty 40

## 2020-11-06 MED ORDER — PROPOFOL 10 MG/ML IV BOLUS
INTRAVENOUS | Status: DC | PRN
  Administered 2020-11-06: 200 mg via INTRAVENOUS

## 2020-11-06 MED ORDER — EPHEDRINE 5 MG/ML INJ
INTRAVENOUS | Status: AC
  Filled 2020-11-06: qty 30

## 2020-11-06 MED ORDER — PROPOFOL 500 MG/50ML IV EMUL
INTRAVENOUS | Status: AC
  Filled 2020-11-06: qty 150

## 2020-11-06 MED ORDER — CEFAZOLIN SODIUM-DEXTROSE 2-4 GM/100ML-% IV SOLN
INTRAVENOUS | Status: AC
  Filled 2020-11-06: qty 100

## 2020-11-06 MED ORDER — GABAPENTIN 300 MG PO CAPS
ORAL_CAPSULE | ORAL | Status: AC
  Filled 2020-11-06: qty 1

## 2020-11-06 MED ORDER — PROPOFOL 500 MG/50ML IV EMUL
INTRAVENOUS | Status: DC | PRN
  Administered 2020-11-06: 25 ug/kg/min via INTRAVENOUS

## 2020-11-06 MED ORDER — TRAMADOL HCL 50 MG PO TABS: 50.0000 mg | ORAL_TABLET | Freq: Four times a day (QID) | ORAL | 1 refills | Status: DC | PRN

## 2020-11-06 MED ORDER — CELECOXIB 200 MG PO CAPS
ORAL_CAPSULE | ORAL | Status: AC
  Filled 2020-11-06: qty 1

## 2020-11-06 MED ORDER — ONDANSETRON HCL 4 MG/2ML IJ SOLN
INTRAMUSCULAR | Status: AC
  Filled 2020-11-06: qty 6

## 2020-11-06 MED ORDER — PROMETHAZINE HCL 25 MG/ML IJ SOLN: 6.2500 mg | INTRAMUSCULAR | Status: DC | PRN

## 2020-11-06 MED ORDER — MIDAZOLAM HCL 2 MG/2ML IJ SOLN
INTRAMUSCULAR | Status: AC
  Filled 2020-11-06: qty 2

## 2020-11-06 MED ORDER — EPHEDRINE SULFATE 50 MG/ML IJ SOLN
INTRAMUSCULAR | Status: DC | PRN
  Administered 2020-11-06: 20 mg via INTRAVENOUS

## 2020-11-06 MED ORDER — AMISULPRIDE (ANTIEMETIC) 5 MG/2ML IV SOLN: 10.0000 mg | Freq: Once | INTRAVENOUS | Status: DC | PRN

## 2020-11-06 MED ORDER — GABAPENTIN 300 MG PO CAPS
300.0000 mg | ORAL_CAPSULE | ORAL | Status: AC
  Administered 2020-11-06: 300 mg via ORAL

## 2020-11-06 MED ORDER — LIDOCAINE 2% (20 MG/ML) 5 ML SYRINGE
INTRAMUSCULAR | Status: DC | PRN
  Administered 2020-11-06: 60 mg via INTRAVENOUS

## 2020-11-06 MED ORDER — DEXAMETHASONE SODIUM PHOSPHATE 10 MG/ML IJ SOLN
INTRAMUSCULAR | Status: AC
  Filled 2020-11-06: qty 4

## 2020-11-06 MED ORDER — ONDANSETRON HCL 4 MG/2ML IJ SOLN
INTRAMUSCULAR | Status: AC
  Filled 2020-11-06: qty 12

## 2020-11-06 MED ORDER — EPHEDRINE 5 MG/ML INJ
INTRAVENOUS | Status: AC
  Filled 2020-11-06: qty 20

## 2020-11-06 MED ORDER — PHENYLEPHRINE HCL (PRESSORS) 10 MG/ML IV SOLN
INTRAVENOUS | Status: DC | PRN
  Administered 2020-11-06 (×6): 80 ug via INTRAVENOUS

## 2020-11-06 MED ORDER — DEXAMETHASONE SODIUM PHOSPHATE 4 MG/ML IJ SOLN
INTRAMUSCULAR | Status: DC | PRN
  Administered 2020-11-06: 10 mg via INTRAVENOUS

## 2020-11-06 SURGICAL SUPPLY — 44 items
ADH SKN CLS APL DERMABOND .7 (GAUZE/BANDAGES/DRESSINGS) ×1
APL PRP STRL LF DISP 70% ISPRP (MISCELLANEOUS) ×1
APPLIER CLIP 9.375 MED OPEN (MISCELLANEOUS) ×3
APR CLP MED 9.3 20 MLT OPN (MISCELLANEOUS) ×1
BINDER BREAST LRG (GAUZE/BANDAGES/DRESSINGS) ×3 IMPLANT
BLADE SURG 15 STRL LF DISP TIS (BLADE) ×1 IMPLANT
BLADE SURG 15 STRL SS (BLADE) ×3
CANISTER SUC SOCK COL 7IN (MISCELLANEOUS) IMPLANT
CANISTER SUCT 1200ML W/VALVE (MISCELLANEOUS) IMPLANT
CHLORAPREP W/TINT 26 (MISCELLANEOUS) ×3 IMPLANT
CLIP APPLIE 9.375 MED OPEN (MISCELLANEOUS) ×1 IMPLANT
COVER BACK TABLE 60X90IN (DRAPES) ×3 IMPLANT
COVER MAYO STAND STRL (DRAPES) ×3 IMPLANT
COVER PROBE W GEL 5X96 (DRAPES) ×3 IMPLANT
COVER WAND RF STERILE (DRAPES) IMPLANT
DECANTER SPIKE VIAL GLASS SM (MISCELLANEOUS) ×3 IMPLANT
DERMABOND ADVANCED (GAUZE/BANDAGES/DRESSINGS) ×2
DERMABOND ADVANCED .7 DNX12 (GAUZE/BANDAGES/DRESSINGS) ×1 IMPLANT
DRAPE LAPAROSCOPIC ABDOMINAL (DRAPES) ×3 IMPLANT
DRAPE UTILITY XL STRL (DRAPES) ×3 IMPLANT
ELECT COATED BLADE 2.86 ST (ELECTRODE) ×3 IMPLANT
ELECT REM PT RETURN 9FT ADLT (ELECTROSURGICAL) ×3
ELECTRODE REM PT RTRN 9FT ADLT (ELECTROSURGICAL) ×1 IMPLANT
GLOVE BIO SURGEON STRL SZ7.5 (GLOVE) ×6 IMPLANT
GOWN STRL REUS W/ TWL LRG LVL3 (GOWN DISPOSABLE) ×2 IMPLANT
GOWN STRL REUS W/TWL LRG LVL3 (GOWN DISPOSABLE) ×6
ILLUMINATOR WAVEGUIDE N/F (MISCELLANEOUS) IMPLANT
KIT MARKER MARGIN INK (KITS) ×3 IMPLANT
LIGHT WAVEGUIDE WIDE FLAT (MISCELLANEOUS) IMPLANT
NEEDLE HYPO 25X1 1.5 SAFETY (NEEDLE) ×3 IMPLANT
NS IRRIG 1000ML POUR BTL (IV SOLUTION) IMPLANT
PACK BASIN DAY SURGERY FS (CUSTOM PROCEDURE TRAY) ×3 IMPLANT
PENCIL SMOKE EVACUATOR (MISCELLANEOUS) ×3 IMPLANT
SLEEVE SCD COMPRESS KNEE MED (MISCELLANEOUS) ×3 IMPLANT
SPONGE LAP 18X18 RF (DISPOSABLE) ×3 IMPLANT
SUT MON AB 4-0 PC3 18 (SUTURE) ×3 IMPLANT
SUT SILK 2 0 SH (SUTURE) IMPLANT
SUT VICRYL 3-0 CR8 SH (SUTURE) ×3 IMPLANT
SYR CONTROL 10ML LL (SYRINGE) ×3 IMPLANT
TOWEL GREEN STERILE FF (TOWEL DISPOSABLE) ×3 IMPLANT
TRAY FAXITRON CT DISP (TRAY / TRAY PROCEDURE) ×3 IMPLANT
TUBE CONNECTING 20'X1/4 (TUBING) ×1
TUBE CONNECTING 20X1/4 (TUBING) ×2 IMPLANT
YANKAUER SUCT BULB TIP NO VENT (SUCTIONS) IMPLANT

## 2020-11-06 NOTE — Anesthesia Postprocedure Evaluation (Signed)
Anesthesia Post Note  Patient: Tammy Graves  Procedure(s) Performed: LEFT BREAST LUMPECTOMY WITH RADIOACTIVE SEED LOCALIZATION (Left Breast)     Patient location during evaluation: PACU Anesthesia Type: General Level of consciousness: awake Pain management: pain level controlled Vital Signs Assessment: post-procedure vital signs reviewed and stable Respiratory status: spontaneous breathing and respiratory function stable Cardiovascular status: stable Postop Assessment: no apparent nausea or vomiting Anesthetic complications: no   No complications documented.  Last Vitals:  Vitals:   11/06/20 1330 11/06/20 1347  BP: 133/86 (!) 142/95  Pulse: (!) 104 97  Resp: 17 16  Temp:  36.4 C  SpO2: 100% 98%    Last Pain:  Vitals:   11/06/20 1347  TempSrc:   PainSc: 0-No pain                 Merlinda Frederick

## 2020-11-06 NOTE — Discharge Instructions (Signed)
Next dose of Tylenol can be given after 5:00PM.     Post Anesthesia Home Care Instructions  Activity: Get plenty of rest for the remainder of the day. A responsible individual must stay with you for 24 hours following the procedure.  For the next 24 hours, DO NOT: -Drive a car -Operate machinery -Drink alcoholic beverages -Take any medication unless instructed by your physician -Make any legal decisions or sign important papers.  Meals: Start with liquid foods such as gelatin or soup. Progress to regular foods as tolerated. Avoid greasy, spicy, heavy foods. If nausea and/or vomiting occur, drink only clear liquids until the nausea and/or vomiting subsides. Call your physician if vomiting continues.  Special Instructions/Symptoms: Your throat may feel dry or sore from the anesthesia or the breathing tube placed in your throat during surgery. If this causes discomfort, gargle with warm salt water. The discomfort should disappear within 24 hours.  If you had a scopolamine patch placed behind your ear for the management of post- operative nausea and/or vomiting:  1. The medication in the patch is effective for 72 hours, after which it should be removed.  Wrap patch in a tissue and discard in the trash. Wash hands thoroughly with soap and water. 2. You may remove the patch earlier than 72 hours if you experience unpleasant side effects which may include dry mouth, dizziness or visual disturbances. 3. Avoid touching the patch. Wash your hands with soap and water after contact with the patch.     

## 2020-11-06 NOTE — Interval H&P Note (Signed)
History and Physical Interval Note:  11/06/2020 11:25 AM  Tammy Graves  has presented today for surgery, with the diagnosis of LEFT BREAST DUCTAL CARCINOMA IN SITU.  The various methods of treatment have been discussed with the patient and family. After consideration of risks, benefits and other options for treatment, the patient has consented to  Procedure(s): LEFT BREAST LUMPECTOMY WITH RADIOACTIVE SEED LOCALIZATION (Left) as a surgical intervention.  The patient's history has been reviewed, patient examined, no change in status, stable for surgery.  I have reviewed the patient's chart and labs.  Questions were answered to the patient's satisfaction.     Autumn Messing III

## 2020-11-06 NOTE — Anesthesia Procedure Notes (Signed)
Procedure Name: LMA Insertion Date/Time: 11/06/2020 11:54 AM Performed by: Signe Colt, CRNA Pre-anesthesia Checklist: Patient identified, Emergency Drugs available, Suction available and Patient being monitored Patient Re-evaluated:Patient Re-evaluated prior to induction Oxygen Delivery Method: Circle System Utilized Preoxygenation: Pre-oxygenation with 100% oxygen Induction Type: IV induction Ventilation: Mask ventilation without difficulty LMA: LMA inserted LMA Size: 4.0 Number of attempts: 1 Airway Equipment and Method: bite block Placement Confirmation: positive ETCO2 Tube secured with: Tape Dental Injury: Teeth and Oropharynx as per pre-operative assessment

## 2020-11-06 NOTE — Transfer of Care (Signed)
Immediate Anesthesia Transfer of Care Note  Patient: Tammy Graves  Procedure(s) Performed: LEFT BREAST LUMPECTOMY WITH RADIOACTIVE SEED LOCALIZATION (Left Breast)  Patient Location: PACU  Anesthesia Type:General  Level of Consciousness: awake, alert , oriented and patient cooperative  Airway & Oxygen Therapy: Patient Spontanous Breathing and Patient connected to face mask oxygen  Post-op Assessment: Report given to RN and Post -op Vital signs reviewed and stable  Post vital signs: Reviewed and stable  Last Vitals:  Vitals Value Taken Time  BP 142/95 11/06/20 1347  Temp 36.4 C 11/06/20 1347  Pulse 97 11/06/20 1347  Resp 16 11/06/20 1347  SpO2 98 % 11/06/20 1347    Last Pain:  Vitals:   11/06/20 1347  TempSrc:   PainSc: 0-No pain         Complications: No complications documented.

## 2020-11-06 NOTE — Op Note (Signed)
11/06/2020  12:46 PM  PATIENT:  Tammy Graves  54 y.o. female  PRE-OPERATIVE DIAGNOSIS:  LEFT BREAST DUCTAL CARCINOMA IN SITU  POST-OPERATIVE DIAGNOSIS:  LEFT BREAST DUCTAL CARCINOMA IN SITU  PROCEDURE:  Procedure(s): LEFT BREAST LUMPECTOMY WITH RADIOACTIVE SEED LOCALIZATION (Left)  SURGEON:  Surgeon(s) and Role:    * Jovita Kussmaul, MD - Primary  PHYSICIAN ASSISTANT:   ASSISTANTS: none   ANESTHESIA:   local and general  EBL:  minimal   BLOOD ADMINISTERED:none  DRAINS: none   LOCAL MEDICATIONS USED:  MARCAINE     SPECIMEN:  Source of Specimen:  left breast tissue  DISPOSITION OF SPECIMEN:  PATHOLOGY  COUNTS:  YES  TOURNIQUET:  * No tourniquets in log *  DICTATION: .Dragon Dictation   After informed consent was obtained the patient was brought to the operating room and placed in the supine position on the operating table.  After adequate induction of general anesthesia the patient's left breast was prepped with ChloraPrep, allowed to dry, and draped in usual sterile manner.  An appropriate timeout was performed.  Previously an I-125 seed was placed centrally in the left breast to mark an area of ductal carcinoma in situ.  The neoprobe was set to I-125 in the area of radioactivity was readily identified.  The area around this was infiltrated with quarter percent Marcaine.  A curvilinear incision was made along the upper and outer edge of the areola of the left breast with a 15 blade knife.  The incision was carried through the skin and subcutaneous tissue sharply with the electrocautery.  The location of the seed was behind the nipple and areola complex.  I was then able to remove a circular portion of breast tissue sharply around the radioactive seed while checking the area of radioactivity frequently.  This dissection went fairly close to the retronipple tissue.  Once the specimen was removed it was oriented with the appropriate paint colors.  A specimen radiograph was  obtained that showed the clip and seed to be near the center of the specimen.  The specimen was then sent to pathology for further evaluation.  Hemostasis was achieved using the Bovie electrocautery.  The wound was irrigated with saline and infiltrated with more quarter percent Marcaine.  The cavity was marked with clips.  The deep layer of the wound was then closed with layers of interrupted 3-0 Vicryl stitches.  The skin was closed with interrupted 4-0 Monocryl subcuticular stitches.  Dermabond dressings were applied.  The patient tolerated the procedure well.  At the end of the case all needle sponge and instrument counts were correct.  The patient was then awakened and taken to recovery in stable condition.  PLAN OF CARE: Discharge to home after PACU  PATIENT DISPOSITION:  PACU - hemodynamically stable.   Delay start of Pharmacological VTE agent (>24hrs) due to surgical blood loss or risk of bleeding: not applicable

## 2020-11-06 NOTE — Anesthesia Preprocedure Evaluation (Signed)
Anesthesia Evaluation  Patient identified by MRN, date of birth, ID band Patient awake    Reviewed: Allergy & Precautions, NPO status , Patient's Chart, lab work & pertinent test results  Airway Mallampati: I  TM Distance: >3 FB Neck ROM: Full    Dental  (+) Missing,    Pulmonary neg pulmonary ROS,    Pulmonary exam normal breath sounds clear to auscultation       Cardiovascular Exercise Tolerance: Good hypertension,  Rhythm:Regular Rate:Tachycardia     Neuro/Psych PSYCHIATRIC DISORDERS Anxiety Depression negative neurological ROS     GI/Hepatic Neg liver ROS, GERD  ,  Endo/Other  negative endocrine ROS  Renal/GU negative Renal ROS  negative genitourinary   Musculoskeletal negative musculoskeletal ROS (+)   Abdominal   Peds negative pediatric ROS (+)  Hematology negative hematology ROS (+)   Anesthesia Other Findings Breast cancer  Reproductive/Obstetrics negative OB ROS                            Anesthesia Physical Anesthesia Plan  ASA: II  Anesthesia Plan: General   Post-op Pain Management:    Induction: Intravenous  PONV Risk Score and Plan: 3 and Ondansetron, Dexamethasone and Midazolam  Airway Management Planned: LMA  Additional Equipment:   Intra-op Plan:   Post-operative Plan: Extubation in OR  Informed Consent: I have reviewed the patients History and Physical, chart, labs and discussed the procedure including the risks, benefits and alternatives for the proposed anesthesia with the patient or authorized representative who has indicated his/her understanding and acceptance.     Dental advisory given  Plan Discussed with: CRNA  Anesthesia Plan Comments: (VERY anxious)       Anesthesia Quick Evaluation

## 2020-11-06 NOTE — H&P (Signed)
Tammy Graves  Location: [facility] Patient #: [id number] DOB: 1966-11-24 Undefined / Language: Cleophus Molt / Race: Black or African American Female   History of Present Illness  The patient is a 54 year old female who presents with breast cancer.We are asked to see the patient in consultation by Dr. Jana Hakim to evaluate her for a new left breast cancer. The patient is a 54 year old black female who presents with screen detected calcs in the central left breast measuring 1.1cm. this was biopsied and came back as high grade DCIS that was ER and PR +. she otherwise has HTN and does not smoke.   Past Surgical History  Breast Biopsy  Left.  Diagnostic Studies History  Colonoscopy  never Mammogram  within last year Pap Smear  1-5 years ago  Medication History Medications Reconciled  Social History  Alcohol use  Occasional alcohol use. Caffeine use  Carbonated beverages, Tea. No drug use  Tobacco use  Never smoker.  Family History  Arthritis  Family Members In General. Depression  Family Members In General. Heart Disease  Family Members In General. Hypertension  Family Members In General. Ovarian Cancer  Family Members In General.  Pregnancy / Birth History  Age at menarche  21 years. Gravida  2 Irregular periods  Maternal age  15-30 Para  2  Other Problems  Anxiety Disorder  High blood pressure     Review of Systems General Present- Appetite Loss. Not Present- Chills, Fatigue, Fever, Night Sweats, Weight Gain and Weight Loss. Skin Not Present- Change in Wart/Mole, Dryness, Hives, Jaundice, New Lesions, Non-Healing Wounds, Rash and Ulcer. HEENT Present- Wears glasses/contact lenses. Not Present- Earache, Hearing Loss, Hoarseness, Nose Bleed, Oral Ulcers, Ringing in the Ears, Seasonal Allergies, Sinus Pain, Sore Throat, Visual Disturbances and Yellow Eyes. Respiratory Not Present- Bloody sputum, Chronic Cough, Difficulty Breathing, Snoring  and Wheezing. Breast Not Present- Breast Mass, Breast Pain, Nipple Discharge and Skin Changes. Cardiovascular Not Present- Chest Pain, Difficulty Breathing Lying Down, Leg Cramps, Palpitations, Rapid Heart Rate, Shortness of Breath and Swelling of Extremities. Gastrointestinal Not Present- Abdominal Pain, Bloating, Bloody Stool, Change in Bowel Habits, Chronic diarrhea, Constipation, Difficulty Swallowing, Excessive gas, Gets full quickly at meals, Hemorrhoids, Indigestion, Nausea, Rectal Pain and Vomiting. Female Genitourinary Not Present- Frequency, Nocturia, Painful Urination, Pelvic Pain and Urgency. Musculoskeletal Not Present- Back Pain, Joint Pain, Joint Stiffness, Muscle Pain, Muscle Weakness and Swelling of Extremities. Neurological Not Present- Decreased Memory, Fainting, Headaches, Numbness, Seizures, Tingling, Tremor, Trouble walking and Weakness. Psychiatric Present- Frequent crying. Not Present- Anxiety, Bipolar, Change in Sleep Pattern, Depression and Fearful. Endocrine Not Present- Cold Intolerance, Excessive Hunger, Hair Changes, Heat Intolerance, Hot flashes and New Diabetes. Hematology Present- Easy Bruising. Not Present- Blood Thinners, Excessive bleeding, Gland problems, HIV and Persistent Infections.   Physical Exam  General Mental Status-Alert. General Appearance-Consistent with stated age. Hydration-Well hydrated. Voice-Normal.  Head and Neck Head-normocephalic, atraumatic with no lesions or palpable masses. Trachea-midline. Thyroid Gland Characteristics - normal size and consistency.  Eye Eyeball - Bilateral-Extraocular movements intact. Sclera/Conjunctiva - Bilateral-No scleral icterus.  Chest and Lung Exam Chest and lung exam reveals -quiet, even and easy respiratory effort with no use of accessory muscles and on auscultation, normal breath sounds, no adventitious sounds and normal vocal resonance. Inspection Chest Wall - Normal. Back -  normal.  Breast Note: there is no palpable mass in either breast. there is no palpable axillary, supraclavicular, or cervical lymphadenopathy   Cardiovascular Cardiovascular examination reveals -normal heart sounds,  regular rate and rhythm with no murmurs and normal pedal pulses bilaterally.  Abdomen Inspection Inspection of the abdomen reveals - No Hernias. Skin - Scar - no surgical scars. Palpation/Percussion Palpation and Percussion of the abdomen reveal - Soft, Non Tender, No Rebound tenderness, No Rigidity (guarding) and No hepatosplenomegaly. Auscultation Auscultation of the abdomen reveals - Bowel sounds normal.  Neurologic Neurologic evaluation reveals -alert and oriented x 3 with no impairment of recent or remote memory. Mental Status-Normal.  Musculoskeletal Normal Exam - Left-Upper Extremity Strength Normal and Lower Extremity Strength Normal. Normal Exam - Right-Upper Extremity Strength Normal and Lower Extremity Strength Normal.  Lymphatic Head & Neck  General Head & Neck Lymphatics: Bilateral - Description - Normal. Axillary  General Axillary Region: Bilateral - Description - Normal. Tenderness - Non Tender. Femoral & Inguinal  Generalized Femoral & Inguinal Lymphatics: Bilateral - Description - Normal. Tenderness - Non Tender.    Assessment & Plan  DUCTAL CARCINOMA IN SITU (DCIS) OF LEFT BREAST (D05.12) Impression: The patient appears to have a 1.1cm area of dcis in the central left breast. I have discussed with her the different options for treatment and at this point she favors breast conservation. She will not need a node evaluation. I have discussed with her in detail the risks and benefits of the surgery as well as some of the technical aspects including the use of a radioactive seed for localization and she understands and wishes to proceed. she will also meet with medical and radiation oncology to discuss adjuvant therapy. This patient  encounter took 60 minutes today to perform the following: take history, perform exam, review outside records, interpret imaging, counsel the patient on their diagnosis and document encounter, findings & plan in the EHR Current Plans Referred to Oncology, for evaluation and follow up (Oncology). Routine.

## 2020-11-09 ENCOUNTER — Encounter (HOSPITAL_BASED_OUTPATIENT_CLINIC_OR_DEPARTMENT_OTHER): Payer: Self-pay | Admitting: General Surgery

## 2020-11-09 LAB — SURGICAL PATHOLOGY

## 2020-11-10 ENCOUNTER — Encounter: Payer: Self-pay | Admitting: Osteopathic Medicine

## 2020-11-10 NOTE — Telephone Encounter (Signed)
Pt missed work for presumably lumpectomy Please call [facility] and ask then NOT to refer patients to [place] for post-surgical work clearance notes, please.

## 2020-11-12 ENCOUNTER — Encounter: Payer: Self-pay | Admitting: *Deleted

## 2020-11-12 DIAGNOSIS — D0512 Intraductal carcinoma in situ of left breast: Secondary | ICD-10-CM

## 2020-11-16 ENCOUNTER — Encounter: Payer: Self-pay | Admitting: *Deleted

## 2020-11-17 ENCOUNTER — Other Ambulatory Visit: Payer: Self-pay | Admitting: Oncology

## 2020-11-23 ENCOUNTER — Inpatient Hospital Stay: Payer: 59 | Admitting: Oncology

## 2020-12-04 ENCOUNTER — Encounter: Payer: Self-pay | Admitting: Osteopathic Medicine

## 2020-12-15 ENCOUNTER — Telehealth: Payer: Self-pay | Admitting: Osteopathic Medicine

## 2020-12-15 NOTE — Telephone Encounter (Signed)
Pt will need to come in for an evaluation since last visit >6 mths. Pls contact the pt to schedule the next available appointment with provider.  Can also schedule with SaraBeth or Joy if needed. Thanks in advance.

## 2020-12-15 NOTE — Telephone Encounter (Signed)
Pt called.  She was seen about 6 mnths ago for issue with one of her ears and Dr A   gave her a cream and it cleared up that ear. Now she's  having the same issue with the other ear and wants to know if you can prescribe the same cream?

## 2020-12-16 NOTE — Telephone Encounter (Signed)
LM on voicemail @ 10am to call her pharmacy to send a refill request and if the pharmacy can't provide the refill request she will need to make an appointment.

## 2020-12-16 NOTE — Telephone Encounter (Signed)
Nothing in her med history for the drops she's asking for, otherwise I would refill to avoid appt in COVID times and give pt instructions to see [place] if worse  so she needs to ask pharmacy to send [place] a refill request for whatever it was, or needs an appt

## 2020-12-24 ENCOUNTER — Telehealth (INDEPENDENT_AMBULATORY_CARE_PROVIDER_SITE_OTHER): Payer: 59 | Admitting: Osteopathic Medicine

## 2020-12-24 ENCOUNTER — Other Ambulatory Visit: Payer: Self-pay

## 2020-12-24 ENCOUNTER — Encounter: Payer: Self-pay | Admitting: Osteopathic Medicine

## 2020-12-24 VITALS — Temp 97.1°F | Wt 157.0 lb

## 2020-12-24 DIAGNOSIS — H9202 Otalgia, left ear: Secondary | ICD-10-CM | POA: Diagnosis not present

## 2020-12-24 DIAGNOSIS — J019 Acute sinusitis, unspecified: Secondary | ICD-10-CM

## 2020-12-24 MED ORDER — IPRATROPIUM BROMIDE 0.06 % NA SOLN
2.0000 | Freq: Four times a day (QID) | NASAL | 1 refills | Status: DC
Start: 2020-12-24 — End: 2023-03-13

## 2020-12-24 MED ORDER — AMOXICILLIN-POT CLAVULANATE 875-125 MG PO TABS
1.0000 | ORAL_TABLET | Freq: Two times a day (BID) | ORAL | 0 refills | Status: DC
Start: 2020-12-24 — End: 2021-02-09

## 2020-12-24 NOTE — Progress Notes (Signed)
Telemedicine Visit via  Video & Audio (App used: [email or web address])   I connected with Tammy Graves on 12/24/20 at 7:52 AM  by phone or  telemedicine application as noted above  I verified that I am speaking with or regarding  the correct patient using two identifiers.  Participants: Myself, Dr Emeterio Reeve DO Patient: Tammy Graves  Patient is at home I am in office at [facility]    I discussed the limitations of evaluation and management  by telemedicine and the availability of in person appointments.  The participant(s) above expressed understanding and  agreed to proceed with this appointment via telemedicine.       History of Present Illness: Tammy Graves is a 55 y.o. female who would like to discuss ear pain and sinus congestion on right ongoing about a week, similar episode last 05/2020 resolved w/ atrovent and augmentin        Observations/Objective: Temp (!) 97.1 F (36.2 C)   Wt 157 lb (71.2 kg)   BMI 26.13 kg/m  BP Readings from Last 3 Encounters:  11/06/20 (!) 142/95  09/30/20 (!) 155/98  05/19/20 129/87   Exam: Normal Speech.  NAD  Lab and Radiology Results No results found for this or any previous visit (from the past 72 hour(s)). No results found.     Assessment and Plan: 55 y.o. female with The primary encounter diagnosis was Acute sinusitis, recurrence not specified, unspecified location. A diagnosis of Otalgia of left ear was also pertinent to this visit.  Tx as below Consider CT sinuses/ear if recurrence again in next 6 mos   PDMP not reviewed this encounter. No orders of the defined types were placed in this encounter.  Meds ordered this encounter  Medications  . amoxicillin-clavulanate (AUGMENTIN) 875-125 MG tablet    Sig: Take 1 tablet by mouth 2 (two) times daily.    Dispense:  14 tablet    Refill:  0  . ipratropium (ATROVENT) 0.06 % nasal spray    Sig: Place 2 sprays into both nostrils 4 (four) times daily. As  needed for sinus congestion or ear pressure    Dispense:  15 mL    Refill:  1   There are no Patient Instructions on file for this visit.    Follow Up Instructions: Return if symptoms worsen or fail to improve.    I discussed the assessment and treatment plan with the patient. The patient was provided an opportunity to ask questions and all were answered. The patient agreed with the plan and demonstrated an understanding of the instructions.   The patient was advised to call back or seek an in-person evaluation if any new concerns, if symptoms worsen or if the condition fails to improve as anticipated.  10 minutes of non-face-to-face time was provided during this encounter.      . . . . . . . . . . . . . Marland Kitchen                   Historical information moved to improve visibility of documentation.  Past Medical History:  Diagnosis Date  . Anxiety   . Cancer ([place]) 10/2020   left breast DCIS  . Depression   . Essential hypertension 01/16/2020  . Family history of ovarian cancer   . GERD (gastroesophageal reflux disease)    OTC meds  . History of pre-eclampsia 01/16/2020  . Hypertension    Past Surgical History:  Procedure Laterality Date  . BREAST LUMPECTOMY WITH RADIOACTIVE  SEED LOCALIZATION Left 11/06/2020   Procedure: LEFT BREAST LUMPECTOMY WITH RADIOACTIVE SEED LOCALIZATION;  Surgeon: Jovita Kussmaul, MD;  Location: [place];  Service: General;  Laterality: Left;   Social History   Tobacco Use  . Smoking status: Never Smoker  . Smokeless tobacco: Never Used  Substance Use Topics  . Alcohol use: Not Currently   family history includes Hypertension in her father; Ovarian cancer in her cousin; Ovarian cancer (age of onset: 66) in her maternal grandmother.  Medications: Current Outpatient Medications  Medication Sig Dispense Refill  . amoxicillin-clavulanate (AUGMENTIN) 875-125 MG tablet Take 1 tablet by mouth 2 (two) times  daily. 14 tablet 0  . aspirin 81 MG chewable tablet Chew by mouth daily.    Marland Kitchen ipratropium (ATROVENT) 0.06 % nasal spray Place 2 sprays into both nostrils 4 (four) times daily. As needed for sinus congestion or ear pressure 15 mL 1  . Multiple Vitamin (MULTIVITAMIN WITH MINERALS) TABS tablet Take 1 tablet by mouth daily.    . traMADol (ULTRAM) 50 MG tablet Take 1-2 tablets (50-100 mg total) by mouth every 6 (six) hours as needed. 20 tablet 1  . valsartan-hydrochlorothiazide (DIOVAN-HCT) 320-25 MG tablet TAKE 1 TABLET BY MOUTH DAILY. APPT FOR REFILLS 90 tablet 0   No current facility-administered medications for this visit.   Allergies  Allergen Reactions  . Codeine Nausea And Vomiting  . Other     tomatoes and OJ     If phone visit, billing and coding can please add appropriate modifier if needed

## 2020-12-24 NOTE — Progress Notes (Signed)
Having right ear pain. Intermittent stabbing pain. Laying on the ear hurts.  Meds: Tylenol

## 2020-12-30 ENCOUNTER — Ambulatory Visit: Payer: 59 | Admitting: Oncology

## 2020-12-31 ENCOUNTER — Encounter: Payer: Self-pay | Admitting: *Deleted

## 2021-01-01 ENCOUNTER — Telehealth: Payer: Self-pay | Admitting: Oncology

## 2021-01-01 NOTE — Telephone Encounter (Signed)
Left message with rescheduled appointment per 1/27 schedule message. Gave option to call back to reschedule if needed.

## 2021-01-04 ENCOUNTER — Encounter: Payer: Self-pay | Admitting: *Deleted

## 2021-01-06 ENCOUNTER — Telehealth: Payer: Self-pay | Admitting: Oncology

## 2021-01-06 NOTE — Telephone Encounter (Signed)
Rescheduled appts per 2/1 staff msg. Left voicemail with appt date and time.

## 2021-01-12 ENCOUNTER — Encounter: Payer: Self-pay | Admitting: *Deleted

## 2021-01-12 ENCOUNTER — Ambulatory Visit: Payer: 59 | Admitting: Oncology

## 2021-01-13 ENCOUNTER — Telehealth: Payer: Self-pay | Admitting: Oncology

## 2021-01-13 NOTE — Telephone Encounter (Signed)
Returned pt's phone call about rescheduling appt with GM. Left voicemail for pt to call office to reschedule.

## 2021-01-20 ENCOUNTER — Other Ambulatory Visit: Payer: Self-pay | Admitting: Osteopathic Medicine

## 2021-01-25 ENCOUNTER — Inpatient Hospital Stay: Payer: 59 | Admitting: Oncology

## 2021-01-26 ENCOUNTER — Encounter: Payer: Self-pay | Admitting: *Deleted

## 2021-02-08 NOTE — Progress Notes (Signed)
[place]  [phone] [phone]) [phone]     ID: Tammy Graves DOB: 1966-11-19  MR#: [id number]  JOA#:[id number]  Patient Care Team: Emeterio Reeve, DO as PCP - General (Osteopathic Medicine) Mauro Kaufmann, RN as Oncology Nurse Navigator Rockwell Germany, RN as Oncology Nurse Navigator Jovita Kussmaul, MD as Consulting Physician (General Surgery) Hoa Deriso, Virgie Dad, MD as Consulting Physician (Oncology) Gery Pray, MD as Consulting Physician (Radiation Oncology) Chauncey Cruel, MD OTHER MD:  I connected with Tammy Graves on 02/09/21 at  3:15 PM EST by telephone visit and verified that I am speaking with the correct person using two identifiers.   I discussed the limitations, risks, security and privacy concerns of performing an evaluation and management service by telemedicine and the availability of in-person appointments. I also discussed with the patient that there may be a patient responsible charge related to this service. The patient expressed understanding and agreed to proceed.   Other persons participating in the visit and their role in the encounter: None  Patient's location: Work Provider's location: [facility]  Total time spent: 15 min   CHIEF COMPLAINT: Estrogen receptor positive DCIS  CURRENT TREATMENT: Tamoxifen   INTERVAL HISTORY: Tammy Graves was contacted today for follow up of her noninvasive breast cancer. She was evaluated in the multidisciplinary breast cancer clinic on 09/30/2020.  She underwent left lumpectomy on 11/06/2020 under Dr. Marlou Starks. 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  The patient has decided against adjuvant radiation  REVIEW OF SYSTEMS: Tammy Graves tells me she is doing fine.  A review of systems today was  otherwise noncontributory   COVID 19 VACCINATION STATUS:    HISTORY OF CURRENT ILLNESS: From the original intake note:  Tammy Graves had her first routine screening mammography on 08/27/2020 showing a possible abnormality in the right breast. She underwent bilateral diagnostic mammography with tomography and right breast ultrasonography at [facility] on 09/10/2020 showing: breast density category C; suspicious 1.1 cm left breast calcifications; no suspicious masses or abnormalities in the right breast.  Accordingly on 09/22/2020 she proceeded to biopsy of the left breast area in question. The pathology from this procedure ([id number]) showed: ductal carcinoma in situ, high grade, partially involving a sclerotic lesion. Prognostic indicators significant for: estrogen receptor, 90% positive with strong staining intensity and progesterone receptor, 30% positive with moderate staining intensity.   The patient's subsequent history is as detailed below.   PAST MEDICAL HISTORY: Past Medical History:  Diagnosis Date  . Anxiety   . Cancer ([place]) 10/2020   left breast DCIS  . Depression   . Essential hypertension 01/16/2020  . Family history of ovarian cancer   . GERD (gastroesophageal reflux disease)    OTC meds  . History of pre-eclampsia 01/16/2020  . Hypertension     PAST SURGICAL HISTORY: Past Surgical History:  Procedure Laterality Date  . BREAST LUMPECTOMY WITH RADIOACTIVE SEED LOCALIZATION Left 11/06/2020   Procedure: LEFT BREAST LUMPECTOMY WITH RADIOACTIVE SEED LOCALIZATION;  Surgeon: Jovita Kussmaul, MD;  Location: [facility];  Service: General;  Laterality: Left;    FAMILY HISTORY: Family History  Problem Relation Age of Onset  . Hypertension Father   . Ovarian cancer Cousin   . Ovarian cancer Maternal Grandmother 39   Her father is age 20, as of 22-Sep-2020. Her mother died at age  25 from heart failure (postpartum). Tammy Graves has one full brother. She also has a  half-brother and a half-sister. She reports stomach cancer in a maternal cousin at age 56 and ovarian cancer in her maternal grandmother at age 44.   GYNECOLOGIC HISTORY:  No LMP recorded. Patient is perimenopausal. Menarche: unsure Age at first live birth: 55 years old [place] P 1 LMP 09/08/2020, some irregularity Contraceptive: never used HRT n/a  Hysterectomy? no BSO? 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 She describes herself as single. She lives at home with her son Tammy Graves, age 47, who is the Mudlogger of student affairs and Architectural technologist at [employer]. She attends [employer].    ADVANCED DIRECTIVES: not in place; at the 09/30/2020 visit the patient was given the appropriate documents to complete and notarized at her discretion   HEALTH MAINTENANCE: Social History   Tobacco Use  . Smoking status: Never Smoker  . Smokeless tobacco: Never Used  Vaping Use  . Vaping Use: Never used  Substance Use Topics  . Alcohol use: Not Currently  . Drug use: Not Currently     Colonoscopy: never done  PAP: date unsure  Bone density: never done   Allergies  Allergen Reactions  . Codeine Nausea And Vomiting  . Other     tomatoes and OJ    Current Outpatient Medications  Medication Sig Dispense Refill  . amoxicillin-clavulanate (AUGMENTIN) 875-125 MG tablet Take 1 tablet by mouth 2 (two) times daily. 14 tablet 0  . aspirin 81 MG chewable tablet Chew by mouth daily.    Marland Kitchen ipratropium (ATROVENT) 0.06 % nasal spray Place 2 sprays into both nostrils 4 (four) times daily. As needed for sinus congestion or ear pressure 15 mL 1  . Multiple Vitamin (MULTIVITAMIN WITH MINERALS) TABS tablet Take 1 tablet by mouth daily.    . traMADol (ULTRAM) 50 MG tablet Take 1-2 tablets (50-100 mg total) by mouth every 6 (six) hours as needed. 20 tablet 1  . valsartan-hydrochlorothiazide (DIOVAN-HCT) 320-25 MG  tablet TAKE 1 TABLET BY MOUTH DAILY. APPT FOR REFILLS 30 tablet 0   No current facility-administered medications for this visit.    OBJECTIVE: African-American woman who appears younger than stated age  There were no vitals filed for this visit.   There is no height or weight on file to calculate BMI.   Wt Readings from Last 3 Encounters:  12/24/20 157 lb (71.2 kg)  11/06/20 154 lb 1.6 oz (69.9 kg)  09/30/20 157 lb (71.2 kg)      ECOG FS:1 - Symptomatic but completely ambulatory  Telemedicine visit 02/09/2021  LAB RESULTS:  CMP     Component Value Date/Time   NA 137 11/03/2020 1500   K 3.5 11/03/2020 1500   CL 100 11/03/2020 1500   CO2 26 11/03/2020 1500   GLUCOSE 102 (H) 11/03/2020 1500   BUN 14 11/03/2020 1500   CREATININE 0.68 11/03/2020 1500   CREATININE 0.75 09/30/2020 1213   CREATININE 0.60 09/22/2019 1351   CALCIUM 10.0 11/03/2020 1500   PROT 7.4 09/30/2020 1213   ALBUMIN 4.1 09/30/2020 1213   AST 19 09/30/2020 1213   ALT 23 09/30/2020 1213   ALKPHOS 85 09/30/2020 1213   BILITOT 0.9 09/30/2020 1213   GFRNONAA >60 11/03/2020 1500   GFRNONAA >60 09/30/2020 1213    No results found for: TOTALPROTELP, ALBUMINELP, A1GS, A2GS, BETS, BETA2SER, [place], MSPIKE,  SPEI  Lab Results  Component Value Date   WBC 4.4 09/30/2020   NEUTROABS 2.3 09/30/2020   HGB 12.8 09/30/2020   HCT 37.6 09/30/2020   MCV 90.6 09/30/2020   PLT 231 09/30/2020    No results found for: LABCA2  No components found for: JEHUDJ497  No results for input(s): INR in the last 168 hours.  No results found for: LABCA2  No results found for: WYO378  No results found for: HYI502  No results found for: DXA128  No results found for: CA2729  No components found for: HGQUANT  No results found for: CEA1 / No results found for: CEA1   No results found for: AFPTUMOR  No results found for: CHROMOGRNA  No results found for: KPAFRELGTCHN, LAMBDASER, KAPLAMBRATIO (kappa/lambda light  chains)  No results found for: HGBA, HGBA2QUANT, HGBFQUANT, HGBSQUAN (Hemoglobinopathy evaluation)   No results found for: LDH  No results found for: IRON, TIBC, IRONPCTSAT (Iron and TIBC)  No results found for: FERRITIN  Urinalysis No results found for: COLORURINE, APPEARANCEUR, LABSPEC, PHURINE, GLUCOSEU, HGBUR, BILIRUBINUR, KETONESUR, PROTEINUR, UROBILINOGEN, NITRITE, LEUKOCYTESUR   STUDIES: No results found.   ELIGIBLE FOR AVAILABLE RESEARCH PROTOCOL: AET  ASSESSMENT: 55 y.o. Tammy Graves woman status post left breast biopsy 09/22/2020 for ductal carcinoma in situ, grade 3, estrogen and progesterone receptor positive.  (1) genetics testing 11/06/2020 for ductal carcinoma in situ, 2.0 cm, high-grade, with negative margins  (2) status post left lumpectomy 11/06/2020 for a 2.0 cm ductal carcinoma in situ, grade 3, with negative margins  (3) patient opted against adjuvant radiation  (4) tamoxifen started 02/09/2021   PLAN: Tammy Graves never met with radiation oncology.  She did her own research and decided she really did not want to receive radiation.  On the other hand she is open to antiestrogens.  We discussed tamoxifen in detail and she has a good understanding of the possible toxicities side effects and complications including the risk of blood clots and endometrial cancer.  I went ahead and placed the prescription in for her today.  She will call us if she has any unusual side effects.  Otherwise I will catch up again by virtual visit with her in about 6 weeks.  If she is tolerating tamoxifen well the plan will be to continue that a total of 5 years  Total encounter time 15 minutes.Sarajane Jews C. Chondra Boyde, MD 02/09/2021 3:10 PM Medical Oncology and Hematology [facility] [place], [place] [address] Tel. [phone]    Fax. [phone]   This document serves as a record of services personally performed by Lurline Del, MD. It was  created on his behalf by Wilburn Mylar, a trained medical scribe. The creation of this record is based on the scribe's personal observations and the provider's statements to them.   I, Lurline Del MD, have reviewed the above documentation for accuracy and completeness, and I agree with the above.   *Total Encounter Time as defined by the Centers for Medicare and Medicaid Services includes, in addition to the face-to-face time of a patient visit (documented in the note above) non-face-to-face time: obtaining and reviewing outside history, ordering and reviewing medications, tests or procedures, care coordination (communications with other health care professionals or caregivers) and documentation in the medical record.

## 2021-02-09 ENCOUNTER — Inpatient Hospital Stay: Payer: 59 | Attending: Oncology | Admitting: Oncology

## 2021-02-09 DIAGNOSIS — D0512 Intraductal carcinoma in situ of left breast: Secondary | ICD-10-CM

## 2021-02-09 MED ORDER — TAMOXIFEN CITRATE 20 MG PO TABS: 20.0000 mg | ORAL_TABLET | Freq: Every day | ORAL | 4 refills | Status: DC

## 2021-02-10 ENCOUNTER — Encounter: Payer: Self-pay | Admitting: *Deleted

## 2021-02-23 ENCOUNTER — Other Ambulatory Visit: Payer: Self-pay | Admitting: Osteopathic Medicine

## 2021-03-21 ENCOUNTER — Other Ambulatory Visit: Payer: Self-pay | Admitting: Osteopathic Medicine

## 2021-03-28 ENCOUNTER — Other Ambulatory Visit: Payer: Self-pay | Admitting: Osteopathic Medicine

## 2021-04-14 ENCOUNTER — Other Ambulatory Visit: Payer: Self-pay | Admitting: Osteopathic Medicine

## 2021-05-29 ENCOUNTER — Other Ambulatory Visit: Payer: Self-pay | Admitting: Oncology

## 2021-08-27 ENCOUNTER — Ambulatory Visit: Payer: 59 | Admitting: Medical-Surgical

## 2021-09-02 ENCOUNTER — Ambulatory Visit (INDEPENDENT_AMBULATORY_CARE_PROVIDER_SITE_OTHER): Payer: 59 | Admitting: Family Medicine

## 2021-09-02 ENCOUNTER — Encounter: Payer: Self-pay | Admitting: Family Medicine

## 2021-09-02 ENCOUNTER — Other Ambulatory Visit: Payer: Self-pay

## 2021-09-02 VITALS — BP 164/104 | HR 92 | Wt 169.1 lb

## 2021-09-02 DIAGNOSIS — E876 Hypokalemia: Secondary | ICD-10-CM | POA: Diagnosis not present

## 2021-09-02 DIAGNOSIS — I10 Essential (primary) hypertension: Secondary | ICD-10-CM

## 2021-09-02 MED ORDER — VALSARTAN-HYDROCHLOROTHIAZIDE 320-25 MG PO TABS: 1.0000 | ORAL_TABLET | Freq: Every day | ORAL | 1 refills | Status: DC

## 2021-09-02 NOTE — Progress Notes (Signed)
Established Patient Office Visit  Subjective:  Patient ID: Tammy Graves, female    DOB: 15-Jan-1966  Age: 55 y.o. MRN: [id number]  CC:  Chief Complaint  Patient presents with   Medication Refill    HPI [name] presents for HTN follow-up.  HYPERTENSION: - Medications: valsartan-hctz 320-25 mg daily - Compliance: has been out of valsartan-HCTZ for a few months, previously consistent - Checking BP at home: was <120/80 when taking all meds, but after going without meds for awhile BP started creeping up some, earlier this week was 127/76. - Denies any SOB, recurrent headaches, CP, vision changes, LE edema, dizziness, palpitations, or medication side effects. 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     Past Medical History:  Diagnosis Date   Anxiety    Cancer ([place]) 10/2020   left breast DCIS   Depression    Essential hypertension 01/16/2020   Family history of ovarian cancer    GERD (gastroesophageal reflux disease)    OTC meds   History of pre-eclampsia 01/16/2020   Hypertension     Past Surgical History:  Procedure Laterality Date   BREAST LUMPECTOMY WITH RADIOACTIVE SEED LOCALIZATION Left 11/06/2020   Procedure: LEFT BREAST LUMPECTOMY WITH RADIOACTIVE SEED LOCALIZATION;  Surgeon: Jovita Kussmaul, MD;  Location: [facility];  Service: General;  Laterality: Left;    Family History  Problem Relation Age of Onset   Hypertension Father    Ovarian cancer Cousin    Ovarian cancer Maternal Grandmother 86    Social History   Socioeconomic History   Marital status: Single    Spouse name: Not on file   Number of children: Not on file   Years of education: Not on file   Highest education level: Not on file  Occupational History   Occupation: Teacher    Employer: [employer]   Tobacco Use   Smoking status: Never   Smokeless tobacco: Never  Vaping Use   Vaping Use: Never used  Substance and Sexual Activity   Alcohol use: Not Currently   Drug use: Not Currently   Sexual activity: Not Currently    Partners: Male  Other Topics Concern   Not on file  Social History Narrative   Not on file   Social Determinants of Health   Financial Resource Strain: Not on file  Food Insecurity: Not on file  Transportation Needs: Not on file  Physical Activity: Not on file  Stress: Not on file  Social Connections: Not on file  Intimate Partner Violence: Not on file    Outpatient Medications Prior to Visit  Medication Sig Dispense Refill   aspirin 81 MG chewable tablet Chew by mouth daily.     Multiple Vitamin (MULTIVITAMIN WITH MINERALS) TABS tablet Take 1 tablet by mouth daily.     tamoxifen (NOLVADEX) 20 MG tablet TAKE 1 TABLET BY MOUTH EVERY DAY 90 tablet 4   valsartan-hydrochlorothiazide (DIOVAN-HCT) 320-25 MG tablet TAKE 1 TABLET BY MOUTH DAILY. APPT FOR REFILLS 30 tablet 0   ipratropium (ATROVENT) 0.06 % nasal spray Place 2 sprays into both nostrils 4 (four) times daily. As needed for sinus congestion or ear pressure (Patient not taking: Reported on 09/02/2021) 15 mL 1   No facility-administered medications prior to visit.    Allergies  Allergen Reactions  Codeine Nausea And Vomiting   Other     tomatoes and OJ    ROS Review of Systems All review of systems negative except what is listed in the HPI    Objective:    Physical Exam Vitals reviewed.  Constitutional:      Appearance: Normal appearance.  Cardiovascular:     Rate and Rhythm: Normal rate and regular rhythm.     Pulses: Normal pulses.     Heart sounds: Normal heart sounds.  Pulmonary:     Effort: Pulmonary effort is normal.     Breath sounds: Normal breath sounds.  Musculoskeletal:     Right lower leg: No edema.     Left lower leg: No edema.  Neurological:     General: No focal  deficit present.     Mental Status: She is alert and oriented to person, place, and time.  Psychiatric:        Mood and Affect: Mood normal.        Behavior: Behavior normal.        Thought Content: Thought content normal.        Judgment: Judgment normal.    BP (!) 164/104 (BP Location: Left Arm, Patient Position: Sitting, Cuff Size: Normal)   Pulse 92   Wt 169 lb 1.9 oz (76.7 kg)   BMI 28.14 kg/m  Wt Readings from Last 3 Encounters:  09/02/21 169 lb 1.9 oz (76.7 kg)  12/24/20 157 lb (71.2 kg)  11/06/20 154 lb 1.6 oz (69.9 kg)     Health Maintenance Due  Topic Date Due   HIV Screening  Never done   Hepatitis C Screening  Never done   PAP SMEAR-Modifier  Never done   COLONOSCOPY (Pts 45-47yrs Insurance coverage will need to be confirmed)  Never done    There are no preventive care reminders to display for this patient.  No results found for: TSH Lab Results  Component Value Date   WBC 4.4 09/30/2020   HGB 12.8 09/30/2020   HCT 37.6 09/30/2020   MCV 90.6 09/30/2020   PLT 231 09/30/2020   Lab Results  Component Value Date   NA 137 11/03/2020   K 3.5 11/03/2020   CO2 26 11/03/2020   GLUCOSE 102 (H) 11/03/2020   BUN 14 11/03/2020   CREATININE 0.68 11/03/2020   BILITOT 0.9 09/30/2020   ALKPHOS 85 09/30/2020   AST 19 09/30/2020   ALT 23 09/30/2020   PROT 7.4 09/30/2020   ALBUMIN 4.1 09/30/2020   CALCIUM 10.0 11/03/2020   ANIONGAP 11 11/03/2020   No results found for: CHOL No results found for: HDL No results found for: LDLCALC No results found for: TRIG No results found for: CHOLHDL No results found for: HGBA1C    Assessment & Plan:   1. Essential hypertension Blood pressure is not at goal at for age and co-morbidities.  I recommend restarting valsartan-HCTZ as previously prescribed.  In addition they were instructed on the following: - BP goal <130/80 - monitor and log blood pressures at home - check around the same time each day in a relaxed  setting - Limit salt to <2000 mg/day - Follow DASH eating plan (heart healthy diet) - limit alcohol to 2 standard drinks per day for men and 1 per day for women - avoid tobacco products - get at least 2 hours of regular aerobic exercise weekly Patient aware of signs/symptoms requiring further/urgent evaluation. Labs updated today.   - COMPLETE METABOLIC PANEL WITH GFR -  valsartan-hydrochlorothiazide (DIOVAN-HCT) 320-25 MG tablet; Take 1 tablet by mouth daily. appt for refills  Dispense: 90 tablet; Refill: 1    Follow-up:    Return in about 2 weeks (around 09/16/2021) for nurse visit BP check, bring home BP cuff.    Terrilyn Saver, NP

## 2021-09-02 NOTE — Patient Instructions (Addendum)
-   BP goal <130/80 - monitor and log blood pressures at home - check around the same time each day in a relaxed setting - Limit salt to <2000 mg/day - Follow DASH eating plan (heart healthy diet) - limit alcohol to 2 standard drinks per day for men and 1 per day for women - avoid tobacco products - get at least 2 hours of regular aerobic exercise weekly Patient aware of signs/symptoms requiring further/urgent evaluation. Labs updated today.

## 2021-09-03 LAB — SPECIMEN COMPROMISED

## 2021-09-03 LAB — COMPLETE METABOLIC PANEL WITH GFR
AG Ratio: 1.4 (calc) (ref 1.0–2.5)
ALT: 30 U/L — ABNORMAL HIGH (ref 6–29)
AST: 23 U/L (ref 10–35)
Albumin: 4.4 g/dL (ref 3.6–5.1)
Alkaline phosphatase (APISO): 100 U/L (ref 37–153)
BUN: 18 mg/dL (ref 7–25)
CO2: 30 mmol/L (ref 20–32)
Calcium: 9.9 mg/dL (ref 8.6–10.4)
Chloride: 97 mmol/L — ABNORMAL LOW (ref 98–110)
Creat: 0.66 mg/dL (ref 0.50–1.03)
Globulin: 3.1 g/dL (calc) (ref 1.9–3.7)
Glucose, Bld: 90 mg/dL (ref 65–99)
Potassium: 3.2 mmol/L — ABNORMAL LOW (ref 3.5–5.3)
Sodium: 138 mmol/L (ref 135–146)
Total Bilirubin: 0.8 mg/dL (ref 0.2–1.2)
Total Protein: 7.5 g/dL (ref 6.1–8.1)
eGFR: 102 mL/min/{1.73_m2} (ref >=60)

## 2021-09-03 MED ORDER — POTASSIUM CHLORIDE CRYS ER 20 MEQ PO TBCR: 40.0000 meq | EXTENDED_RELEASE_TABLET | Freq: Every day | ORAL | 0 refills | Status: DC

## 2021-09-03 NOTE — Addendum Note (Signed)
Addended by: Caleen Jobs B on: 09/03/2021 09:02 AM   Modules accepted: Orders

## 2021-09-20 ENCOUNTER — Other Ambulatory Visit: Payer: Self-pay

## 2021-09-20 ENCOUNTER — Ambulatory Visit (INDEPENDENT_AMBULATORY_CARE_PROVIDER_SITE_OTHER): Payer: 59 | Admitting: Family Medicine

## 2021-09-20 VITALS — BP 134/88 | HR 99 | Temp 98.1°F

## 2021-09-20 DIAGNOSIS — I10 Essential (primary) hypertension: Secondary | ICD-10-CM | POA: Diagnosis not present

## 2021-09-20 NOTE — Progress Notes (Signed)
Patient presents today as a nurse visit for a blood pressure check.  Patient states she is taking her medication as prescribed without any side effects/adverse effects. Medication and allergy list reviewed with patient and the pharmacy has been verified.   HA: No Dizziness/lightheadedness: No Fever: No BA: No Weakness/Fatigue: No  Sinus pain/pressure: No  Runny nose: No  ST: No  ShOB: No  CP: No  Palps: No Abd pain: No Dysuria: No  N/V/C/D: No    Vital Signs at 3:33 PM Blood Pressure: 145/88 Pulse: 109 SpO2: 97%  Vital Signs at 3:44 PM Blood Pressure: 134/88 Pulse: 99 SpO2: 99%   Information shared with Dr. Madilyn Fireman  who instructed me to tell pt to continue valsartan-HCTZ 320-25 mg daily as prescribed, continue to monitor BP at home and keep a log, and to schedule a f/u [place] in 1 month and bring home BP monitor with her for comparison. Pt is also due to have a BMP drawn to check her potassium level but to also check other levels since she is restarting valsartan-HCTZ.   Pt aware and verbalized understanding.

## 2021-09-20 NOTE — Patient Instructions (Signed)
Managing Your Hypertension Hypertension, also called high blood pressure, is when the force of the blood pressing against the walls of the arteries is too strong. Arteries are blood vessels that carry blood from your heart throughout your body. Hypertension forces the heart to work harder to pump blood and may cause the arteries tobecome narrow or stiff. Understanding blood pressure readings Your personal target blood pressure may vary depending on your medical conditions, your age, and other factors. A blood pressure reading includes a higher number over a lower number. Ideally, your blood pressure should be below 120/80. You should know that: The first, or top, number is called the systolic pressure. It is a measure of the pressure in your arteries as your heart beats. The second, or bottom number, is called the diastolic pressure. It is a measure of the pressure in your arteries as the heart relaxes. Blood pressure is classified into four stages. Based on your blood pressure reading, your health care provider may use the following stages to determine what type of treatment you need, if any. Systolic pressure and diastolicpressure are measured in a unit called mmHg. Normal Systolic pressure: below 120. Diastolic pressure: below 80. Elevated Systolic pressure: 120-129. Diastolic pressure: below 80. Hypertension stage 1 Systolic pressure: 130-139. Diastolic pressure: 80-89. Hypertension stage 2 Systolic pressure: 140 or above. Diastolic pressure: 90 or above. How can this condition affect me? Managing your hypertension is an important responsibility. Over time, hypertension can damage the arteries and decrease blood flow to important parts of the body, including the brain, heart, and kidneys. Having untreated or uncontrolled hypertension can lead to: A heart attack. A stroke. A weakened blood vessel (aneurysm). Heart failure. Kidney damage. Eye damage. Metabolic syndrome. Memory and  concentration problems. Vascular dementia. What actions can I take to manage this condition? Hypertension can be managed by making lifestyle changes and possibly by taking medicines. Your health care provider will help you make a plan to bring yourblood pressure within a normal range. Nutrition  Eat a diet that is high in fiber and potassium, and low in salt (sodium), added sugar, and fat. An example eating plan is called the Dietary Approaches to Stop Hypertension (DASH) diet. To eat this way: Eat plenty of fresh fruits and vegetables. Try to fill one-half of your plate at each meal with fruits and vegetables. Eat whole grains, such as whole-wheat pasta, brown rice, or whole-grain bread. Fill about one-fourth of your plate with whole grains. Eat low-fat dairy products. Avoid fatty cuts of meat, processed or cured meats, and poultry with skin. Fill about one-fourth of your plate with lean proteins such as fish, chicken without skin, beans, eggs, and tofu. Avoid pre-made and processed foods. These tend to be higher in sodium, added sugar, and fat. Reduce your daily sodium intake. Most people with hypertension should eat less than 1,500 mg of sodium a day.  Lifestyle  Work with your health care provider to maintain a healthy body weight or to lose weight. Ask what an ideal weight is for you. Get at least 30 minutes of exercise that causes your heart to beat faster (aerobic exercise) most days of the week. Activities may include walking, swimming, or biking. Include exercise to strengthen your muscles (resistance exercise), such as weight lifting, as part of your weekly exercise routine. Try to do these types of exercises for 30 minutes at least 3 days a week. Do not use any products that contain nicotine or tobacco, such as cigarettes, e-cigarettes, and chewing   tobacco. If you need help quitting, ask your health care provider. Control any long-term (chronic) conditions you have, such as high  cholesterol or diabetes. Identify your sources of stress and find ways to manage stress. This may include meditation, deep breathing, or making time for fun activities.  Alcohol use Do not drink alcohol if: Your health care provider tells you not to drink. You are pregnant, may be pregnant, or are planning to become pregnant. If you drink alcohol: Limit how much you use to: 0-1 drink a day for women. 0-2 drinks a day for men. Be aware of how much alcohol is in your drink. In the [place] one drink equals one 12 oz bottle of beer (355 mL), one 5 oz glass of wine (148 mL), or one 1 oz glass of hard liquor (44 mL). Medicines Your health care provider may prescribe medicine if lifestyle changes are not enough to get your blood pressure under control and if: Your systolic blood pressure is 130 or higher. Your diastolic blood pressure is 80 or higher. Take medicines only as told by your health care provider. Follow the directions carefully. Blood pressure medicines must be taken as told by your health care provider. The medicine does not work as well when you skip doses. Skippingdoses also puts you at risk for problems. Monitoring Before you monitor your blood pressure: Do not smoke, drink caffeinated beverages, or exercise within 30 minutes before taking a measurement. Use the bathroom and empty your bladder (urinate). Sit quietly for at least 5 minutes before taking measurements. Monitor your blood pressure at home as told by your health care provider. To do this: Sit with your back straight and supported. Place your feet flat on the floor. Do not cross your legs. Support your arm on a flat surface, such as a table. Make sure your upper arm is at heart level. Each time you measure, take two or three readings one minute apart and record the results. You may also need to have your blood pressure checked regularly by your healthcare provider. General information Talk with your health care  provider about your diet, exercise habits, and other lifestyle factors that may be contributing to hypertension. Review all the medicines you take with your health care provider because there may be side effects or interactions. Keep all visits as told by your health care provider. Your health care provider can help you create and adjust your plan for managing your high blood pressure. Where to find more information National Heart, Lung, and Blood Institute: www.nhlbi.nih.gov American Heart Association: www.heart.org Contact a health care provider if: You think you are having a reaction to medicines you have taken. You have repeated (recurrent) headaches. You feel dizzy. You have swelling in your ankles. You have trouble with your vision. Get help right away if: You develop a severe headache or confusion. You have unusual weakness or numbness, or you feel faint. You have severe pain in your chest or abdomen. You vomit repeatedly. You have trouble breathing. These symptoms may represent a serious problem that is an emergency. Do not wait to see if the symptoms will go away. Get medical help right away. Call your local emergency services (911 in the [place] Do not drive yourself to the hospital. Summary Hypertension is when the force of blood pumping through your arteries is too strong. If this condition is not controlled, it may put you at risk for serious complications. Your personal target blood pressure may vary depending on your medical conditions,   your age, and other factors. For most people, a normal blood pressure is less than 120/80. Hypertension is managed by lifestyle changes, medicines, or both. Lifestyle changes to help manage hypertension include losing weight, eating a healthy, low-sodium diet, exercising more, stopping smoking, and limiting alcohol. This information is not intended to replace advice given to you by your health care provider. Make sure you discuss any questions  you have with your healthcare provider. Document Revised: 12/27/2019 Document Reviewed: 10/22/2019 Elsevier Patient Education  2022 Elsevier Inc.  

## 2021-09-20 NOTE — Progress Notes (Signed)
Agree with documentation as above.   Edelin Fryer, MD  

## 2021-09-20 NOTE — Addendum Note (Signed)
Addended by: Beatrice Lecher D on: 09/20/2021 04:29 PM   Modules accepted: Level of Service

## 2021-09-21 LAB — BASIC METABOLIC PANEL
BUN: 20 mg/dL (ref 7–25)
CO2: 31 mmol/L (ref 20–32)
Calcium: 10.1 mg/dL (ref 8.6–10.4)
Chloride: 101 mmol/L (ref 98–110)
Creat: 0.68 mg/dL (ref 0.50–1.03)
Glucose, Bld: 73 mg/dL (ref 65–99)
Potassium: 3.3 mmol/L — ABNORMAL LOW (ref 3.5–5.3)
Sodium: 141 mmol/L (ref 135–146)

## 2021-09-22 MED ORDER — POTASSIUM CHLORIDE CRYS ER 20 MEQ PO TBCR: EXTENDED_RELEASE_TABLET | ORAL | 3 refills | Status: DC

## 2021-09-22 NOTE — Addendum Note (Signed)
Addended by: Caleen Jobs B on: 09/22/2021 09:20 AM   Modules accepted: Orders

## 2021-10-18 ENCOUNTER — Ambulatory Visit: Payer: 59

## 2021-12-13 ENCOUNTER — Telehealth: Payer: Self-pay | Admitting: General Practice

## 2021-12-13 NOTE — Telephone Encounter (Signed)
Transition Care Management Unsuccessful Follow-up Telephone Call  Date of discharge and from where:  12/10/21 from [facility]  Attempts:  1st Attempt  Reason for unsuccessful TCM follow-up call:  Left voice message

## 2021-12-15 NOTE — Telephone Encounter (Signed)
Transition Care Management Unsuccessful Follow-up Telephone Call  Date of discharge and from where:  12/10/21 from [facility]  Attempts:  2nd Attempt  Reason for unsuccessful TCM follow-up call:  Left voice message

## 2021-12-17 NOTE — Telephone Encounter (Signed)
Transition Care Management Unsuccessful Follow-up Telephone Call  Date of discharge and from where:  12/10/21 from [facility]  Attempts:  3rd Attempt  Reason for unsuccessful TCM follow-up call:  No answer/busy

## 2022-01-18 ENCOUNTER — Encounter (HOSPITAL_COMMUNITY): Payer: Self-pay

## 2022-01-25 ENCOUNTER — Telehealth: Payer: Self-pay | Admitting: Osteopathic Medicine

## 2022-01-25 DIAGNOSIS — I10 Essential (primary) hypertension: Secondary | ICD-10-CM

## 2022-01-25 NOTE — Telephone Encounter (Signed)
Patient called and is scheduled for a Transfer of Care on 4/18 but will be out of her meds before then. Would a refill be able to be sent of valsartan-hydrochlorothiazide to get her through until her appt. Per patient she will be out in the middle of March.

## 2022-01-26 MED ORDER — VALSARTAN-HYDROCHLOROTHIAZIDE 320-25 MG PO TABS: 1.0000 | ORAL_TABLET | Freq: Every day | ORAL | 1 refills | Status: DC

## 2022-01-26 NOTE — Telephone Encounter (Signed)
Left msg to let patient know refill was sent to pharmacy.

## 2022-02-27 ENCOUNTER — Other Ambulatory Visit: Payer: Self-pay | Admitting: Medical-Surgical

## 2022-02-27 DIAGNOSIS — I10 Essential (primary) hypertension: Secondary | ICD-10-CM

## 2022-03-21 ENCOUNTER — Encounter: Payer: Self-pay | Admitting: Medical-Surgical

## 2022-03-21 NOTE — Progress Notes (Signed)
?  HPI with pertinent ROS:  ? ?CC: transfer of care ? ?HPI: ?Pleasant 56 year old female presenting today to transfer care to a new PCP and for follow up on hypertension.  ?Medication: valsartan-HTCZ 320-'25mg'$  daily, recent manufacturer change at the end of March and she feels like this is not working as well ?Compliant: Yes ?Side effects: None ?Checking BP at home: Yes, readings 117-120/70s but recently 130s/80s ?Low sodium diet: No ?Exercise: gym twice a week, active at work ?Concerning symptoms: Headaches since brand change  ? ?I reviewed the past medical history, family history, social history, surgical history, and allergies today and no changes were needed.  Please see the problem list section below in epic for further details. ? ? ?Physical exam:  ? ?General: Well Developed, well nourished, and in no acute distress.  ?Neuro: Alert and oriented x3.  ?HEENT: Normocephalic, atraumatic.  ?Skin: Warm and dry. ?Cardiac: Regular rate and rhythm, no murmurs rubs or gallops, no lower extremity edema.  ?Respiratory: Clear to auscultation bilaterally. Not using accessory muscles, speaking in full sentences. ? ?Impression and Recommendations:   ? ?1. Encounter to establish care ?Reviewed available information and discussed care concerns with patient.  ? ?2. Essential hypertension ?Blood pressure 152/93.  Blood pressure apparently well controlled when on her previous manufacturer.  Refilling valsartan-HCTZ 320-25 mg daily but pharmacy note included to provide her previous manufacturer.  Advised to monitor blood pressure at home with goal of 130/80 or less and if consistently higher, return for further evaluation and medication management. ? ?Return in about 3 months (around 06/21/2022) for HTN follow up. ?___________________________________________ ?Clearnce Sorrel, DNP, APRN, FNP-BC ?Primary Care and Sports Medicine ?[place] ?

## 2022-03-22 ENCOUNTER — Encounter: Payer: Self-pay | Admitting: Medical-Surgical

## 2022-03-22 ENCOUNTER — Ambulatory Visit: Payer: BC Managed Care – PPO | Admitting: Medical-Surgical

## 2022-03-22 VITALS — BP 152/93 | HR 114 | Resp 20 | Ht 65.0 in | Wt 174.5 lb

## 2022-03-22 DIAGNOSIS — I10 Essential (primary) hypertension: Secondary | ICD-10-CM | POA: Diagnosis not present

## 2022-03-22 DIAGNOSIS — Z7689 Persons encountering health services in other specified circumstances: Secondary | ICD-10-CM | POA: Diagnosis not present

## 2022-03-22 MED ORDER — VALSARTAN-HYDROCHLOROTHIAZIDE 320-25 MG PO TABS: 1.0000 | ORAL_TABLET | Freq: Every day | ORAL | 1 refills | Status: DC

## 2022-03-23 ENCOUNTER — Encounter: Payer: Self-pay | Admitting: Medical-Surgical

## 2022-03-23 DIAGNOSIS — E7849 Other hyperlipidemia: Secondary | ICD-10-CM

## 2022-03-23 LAB — COMPLETE METABOLIC PANEL WITH GFR
AG Ratio: 1.5 (calc) (ref 1.0–2.5)
ALT: 28 U/L (ref 6–29)
AST: 18 U/L (ref 10–35)
Albumin: 4.4 g/dL (ref 3.6–5.1)
Alkaline phosphatase (APISO): 112 U/L (ref 37–153)
BUN: 15 mg/dL (ref 7–25)
CO2: 30 mmol/L (ref 20–32)
Calcium: 10.2 mg/dL (ref 8.6–10.4)
Chloride: 99 mmol/L (ref 98–110)
Creat: 0.68 mg/dL (ref 0.50–1.03)
Globulin: 3 g/dL (calc) (ref 1.9–3.7)
Glucose, Bld: 99 mg/dL (ref 65–139)
Potassium: 3.3 mmol/L — ABNORMAL LOW (ref 3.5–5.3)
Sodium: 139 mmol/L (ref 135–146)
Total Bilirubin: 0.5 mg/dL (ref 0.2–1.2)
Total Protein: 7.4 g/dL (ref 6.1–8.1)
eGFR: 102 mL/min/{1.73_m2} (ref >=60)

## 2022-03-23 LAB — LIPID PANEL
Cholesterol: 331 mg/dL — ABNORMAL HIGH (ref <200)
HDL: 89 mg/dL (ref >=50)
LDL Cholesterol (Calc): 224 mg/dL (calc) — ABNORMAL HIGH
Non-HDL Cholesterol (Calc): 242 mg/dL (calc) — ABNORMAL HIGH (ref <130)
Total CHOL/HDL Ratio: 3.7 (calc) (ref <5.0)
Triglycerides: 69 mg/dL (ref <150)

## 2022-03-23 MED ORDER — ROSUVASTATIN CALCIUM 10 MG PO TABS: 10.0000 mg | ORAL_TABLET | Freq: Every day | ORAL | 3 refills | Status: DC

## 2022-06-22 ENCOUNTER — Ambulatory Visit: Payer: BC Managed Care – PPO | Admitting: Medical-Surgical

## 2022-06-22 ENCOUNTER — Encounter: Payer: Self-pay | Admitting: Medical-Surgical

## 2022-06-22 VITALS — BP 129/87 | HR 105 | Resp 20 | Ht 65.0 in | Wt 168.5 lb

## 2022-06-22 DIAGNOSIS — E7849 Other hyperlipidemia: Secondary | ICD-10-CM | POA: Diagnosis not present

## 2022-06-22 DIAGNOSIS — I10 Essential (primary) hypertension: Secondary | ICD-10-CM | POA: Diagnosis not present

## 2022-06-22 MED ORDER — PRAVASTATIN SODIUM 20 MG PO TABS: 20.0000 mg | ORAL_TABLET | Freq: Every day | ORAL | 0 refills | Status: DC

## 2022-06-22 NOTE — Progress Notes (Signed)
   Established Patient Office Visit  Subjective   Patient ID: Tammy Graves, female   DOB: August 30, 1966 Age: 56 y.o. MRN: [id number]   Chief Complaint  Patient presents with   Hypertension    HPI Pleasant 56 year old female presenting today for follow-up on hypertension.  She has been taking valsartan-hydrochlorothiazide 320-25 mg daily, tolerating well without side effects.  Monitoring her blood pressure at home and notes that in the last couple of days her readings have been 107/81 and 118/79.  Notes that since she switched the medicine over, her home readings have been very good and always below the 130/80 goal.  Following a low-sodium diet and has gotten an exercise bike which she is using regularly.  She did start rosuvastatin as prescribed and overall tolerated it well however shortly after starting it, she developed significant GI discomfort and gas pains.  She continued taking the medicine because she wants her cholesterol well managed however the symptoms have not resolved and have started to affect her daily activities.   Objective:    Vitals:   06/22/22 0924 06/22/22 0946  BP: (!) 148/111 129/87  Pulse: (!) 113 (!) 105  Resp: 20   Height: '5\' 5"'$  (1.651 m)   Weight: 168 lb 8 oz (76.4 kg)   SpO2: 98%   BMI (Calculated): 28.04    Physical Exam Vitals and nursing note reviewed.  Constitutional:      General: She is not in acute distress.    Appearance: Normal appearance. She is not ill-appearing.  HENT:     Head: Normocephalic and atraumatic.  Cardiovascular:     Rate and Rhythm: Normal rate and regular rhythm.     Pulses: Normal pulses.     Heart sounds: Normal heart sounds.  Pulmonary:     Effort: Pulmonary effort is normal. No respiratory distress.     Breath sounds: Normal breath sounds. No wheezing, rhonchi or rales.  Skin:    General: Skin is warm and dry.  Neurological:     Mental Status: She is alert and oriented to person, place, and time.  Psychiatric:         Mood and Affect: Mood normal.        Behavior: Behavior normal.        Thought Content: Thought content normal.        Judgment: Judgment normal.  No results found for this or any previous visit (from the past 24 hour(s)).     The ASCVD Risk score (Arnett DK, et al., 2019) failed to calculate for the following reasons:   The valid total cholesterol range is 130 to 320 mg/dL   Assessment & Plan:   1. Essential hypertension Blood pressure elevated on arrival however recheck was 129/87.  Home readings have been at goal and look great.  Follow a low-sodium diet.  Continue exercising regularly and work to lose weight to a healthy weight.  Continue valsartan-hydrochlorothiazide as prescribed.  2. Familial hyperlipidemia Adding Crestor as an intolerance due to GI side effects.  Switching to pravastatin 20 mg daily to evaluate tolerance.  Plan to get labs drawn in 6 weeks to evaluate response to the medication and liver function.  Return in about 6 months (around 12/23/2022) for HTN follow up; labs in 6 weeks.  ___________________________________________ Clearnce Sorrel, DNP, APRN, FNP-BC Primary Care and Sports Medicine [place]

## 2022-09-01 IMAGING — MG MM BREAST BX W LOC DEV 1ST LESION IMAGE BX SPEC STEREO GUIDE*L*
5 series · 6 of 13 positions shown · non-contrast
Comparison: Previous exams.
COMPARISON: Previous exams.

Addendum:
CLINICAL DATA: Biopsy of left breast calcifications

EXAM:
LEFT BREAST STEREOTACTIC CORE NEEDLE BIOPSY

[L (1 of 2)]
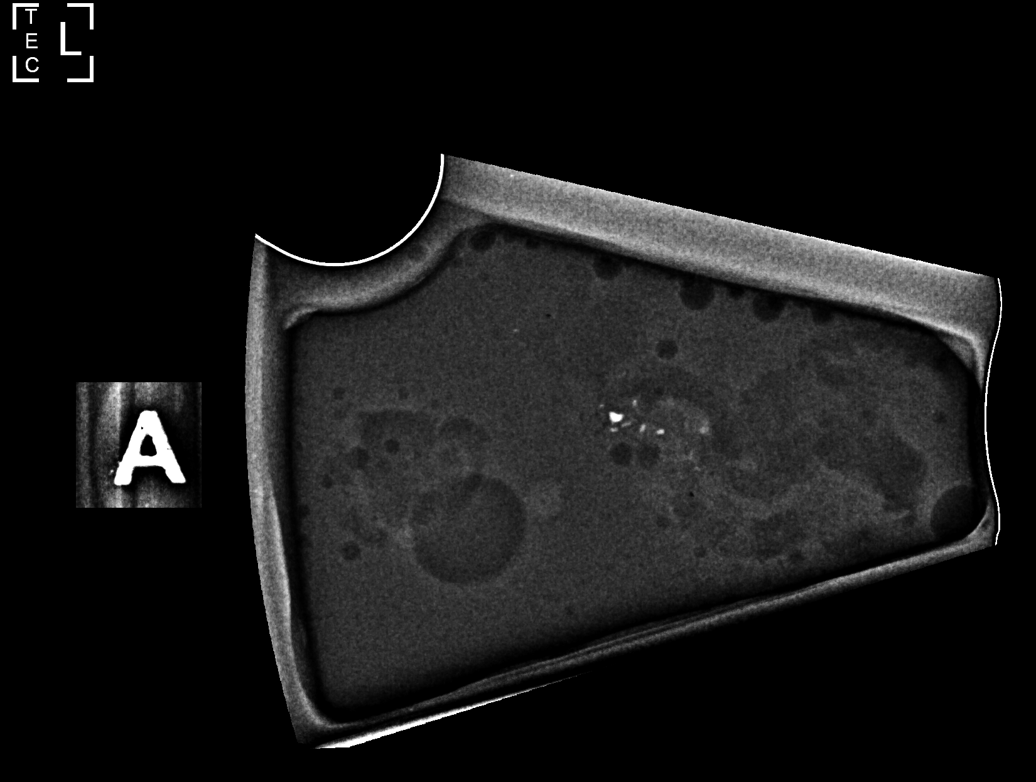

[L (2 of 2)]
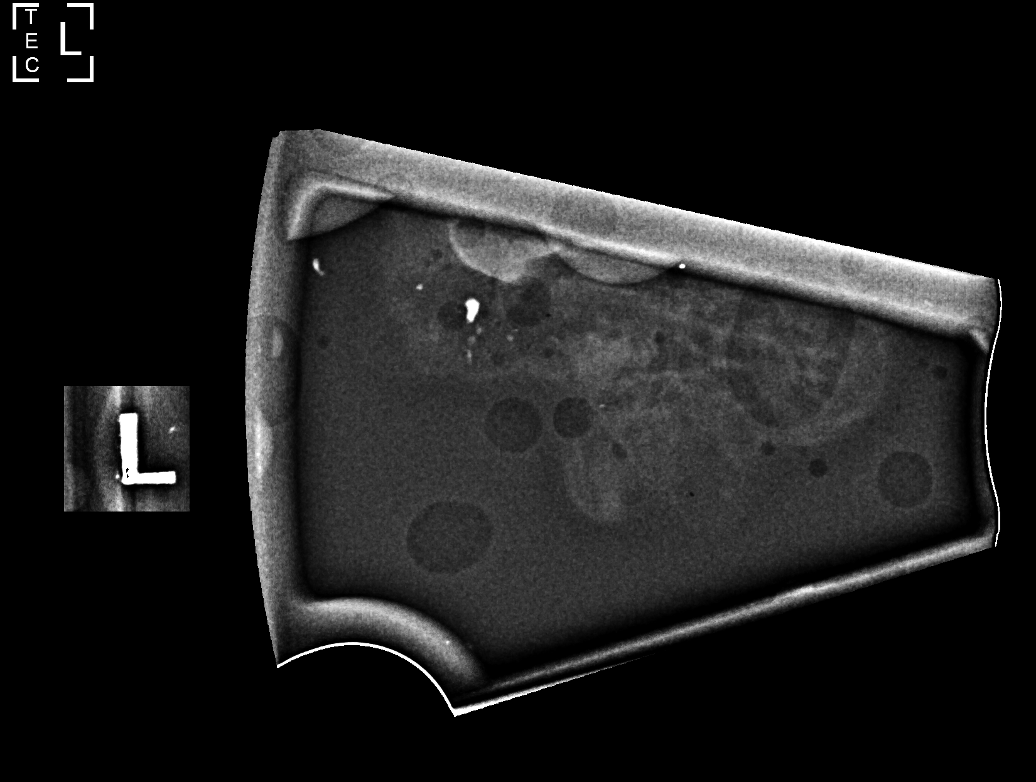

[L CC]
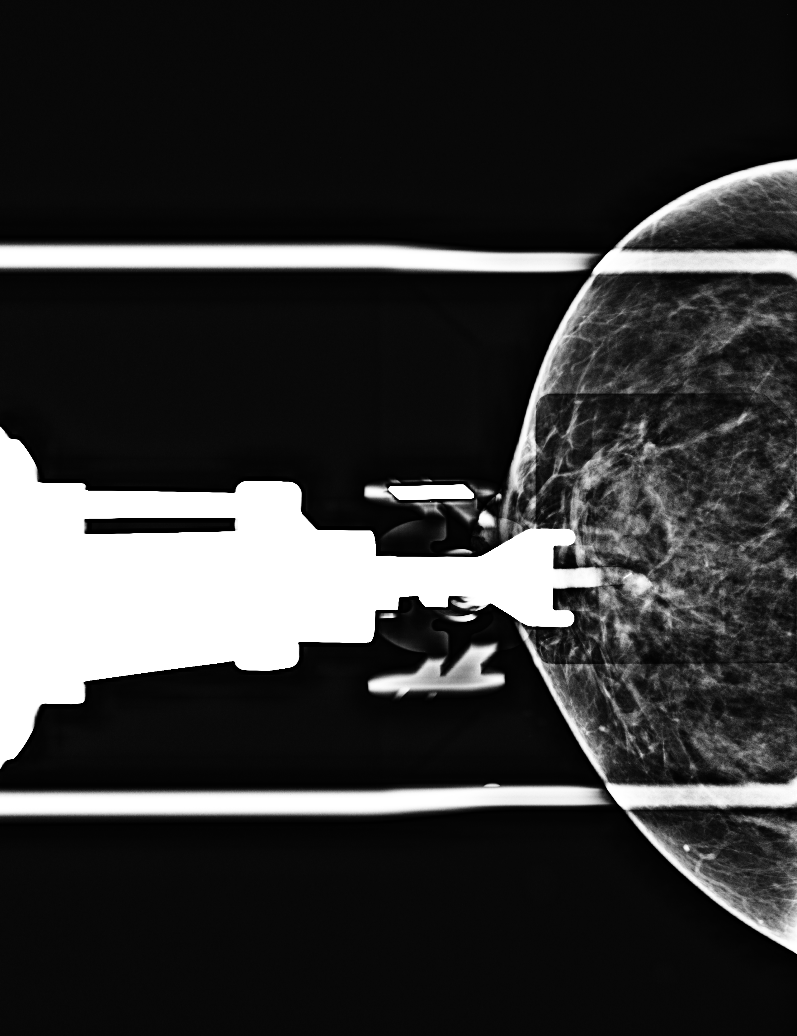

[L CC tomo · 2 of 54 frames shown (1 of 2)]
[frame 18/54]
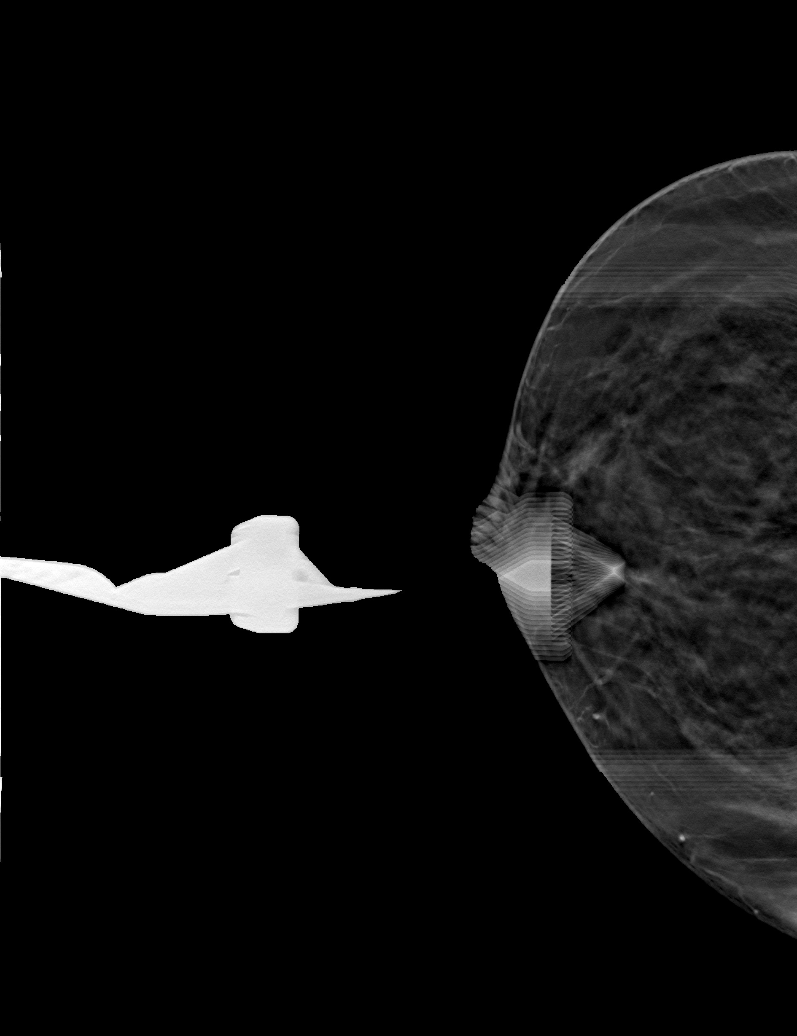
[frame 27/54]
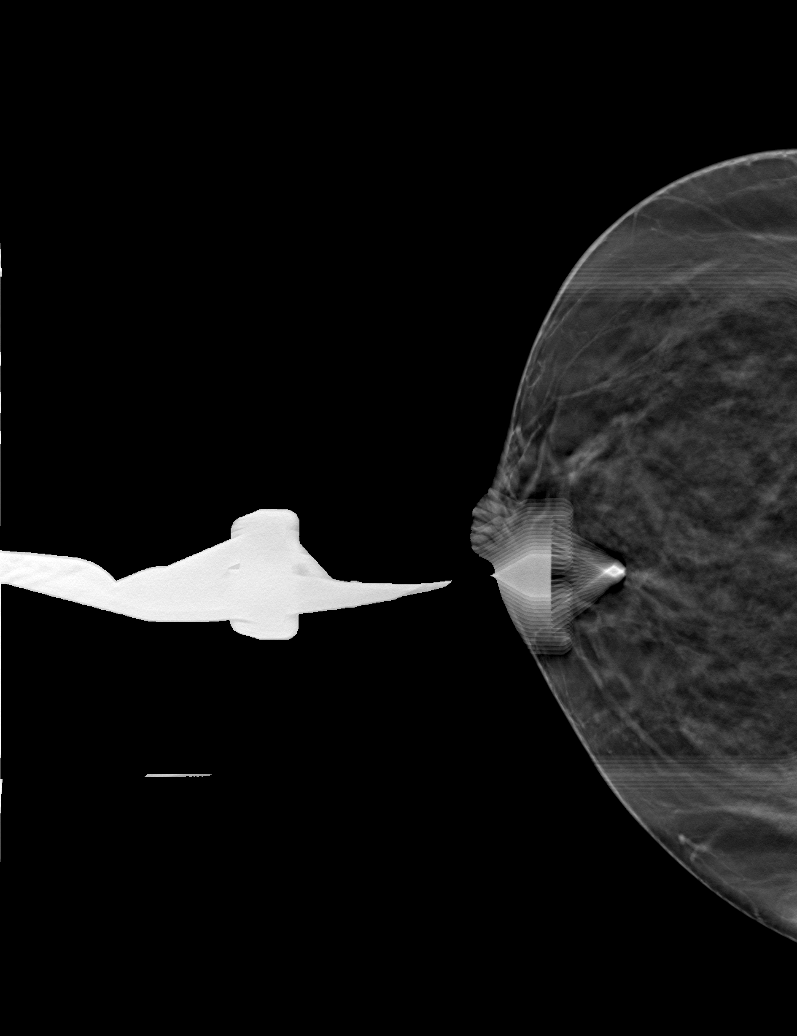

[L CC tomo (2 of 2) · tomo slice 28/55.0]
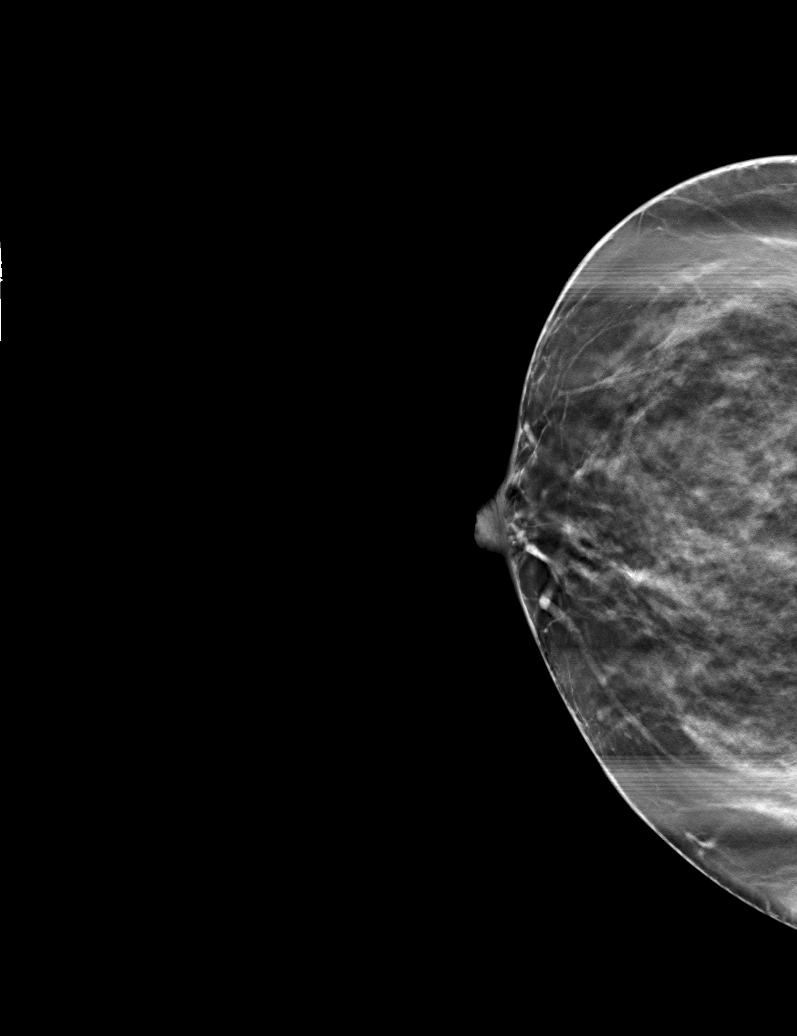

[6 of 13 positions shown; findings below may reference images not displayed]



Using sterile technique and 1% Lidocaine as local anesthetic, under
stereotactic guidance, a 9 gauge vacuum assisted device was used to
perform core needle biopsy of calcifications in the retroareolar
region using a superior approach. Specimen radiograph was performed
showing calcifications in both core specimens. Specimens with
calcifications are identified for pathology.

Lesion quadrant: Superior

At the conclusion of the procedure, X shaped tissue marker clip was
deployed into the biopsy cavity. Follow-up 2-view mammogram was
performed and dictated separately.
IMPRESSION: Stereotactic-guided biopsy of left breast calcifications. No
apparent complications.

ADDENDUM:
Pathology revealed HIGH GRADE DUCTAL CARCINOMA IN SITU WITH
CALCIFICATIONS, partially involving a sclerotic lesion of the Left
breast, retroareolar. This was found to be concordant by Dr. Helen
Born.

Pathology results were discussed with the patient by telephone. The
patient reported doing well after the biopsy with tenderness at the
site. Post biopsy instructions and care were reviewed and questions
were answered. The patient was encouraged to call The [REDACTED] for any additional concerns. My direct phone
number was provided.

The patient was referred to [REDACTED]
[REDACTED] at [REDACTED] on
September 30, 2020.

Consideration for a bilateral breast MRI for further evaluation of
extent of disease given the high grade histology and heterogeneously
dense breasts.

Pathology results reported by Saketh Lamadrid, RN on 09/24/2020.



Using sterile technique and 1% Lidocaine as local anesthetic, under
stereotactic guidance, a 9 gauge vacuum assisted device was used to
perform core needle biopsy of calcifications in the retroareolar
region using a superior approach. Specimen radiograph was performed
showing calcifications in both core specimens. Specimens with
calcifications are identified for pathology.

Lesion quadrant: Superior

At the conclusion of the procedure, X shaped tissue marker clip was
deployed into the biopsy cavity. Follow-up 2-view mammogram was
performed and dictated separately.
IMPRESSION: Stereotactic-guided biopsy of left breast calcifications. No
apparent complications.

## 2022-09-10 ENCOUNTER — Encounter: Payer: Self-pay | Admitting: Medical-Surgical

## 2022-09-12 ENCOUNTER — Other Ambulatory Visit: Payer: Self-pay | Admitting: Medical-Surgical

## 2022-09-12 DIAGNOSIS — I10 Essential (primary) hypertension: Secondary | ICD-10-CM

## 2022-09-18 ENCOUNTER — Other Ambulatory Visit: Payer: Self-pay | Admitting: Medical-Surgical

## 2022-12-06 ENCOUNTER — Telehealth: Payer: Self-pay | Admitting: General Practice

## 2022-12-06 NOTE — Telephone Encounter (Signed)
Transition Care Management Unsuccessful Follow-up Telephone Call  Date of discharge and from where:  12/03/22 from [facility]  Attempts:  1st Attempt  Reason for unsuccessful TCM follow-up call:  No answer/busy

## 2022-12-07 ENCOUNTER — Other Ambulatory Visit: Payer: Self-pay | Admitting: Medical-Surgical

## 2022-12-07 DIAGNOSIS — I10 Essential (primary) hypertension: Secondary | ICD-10-CM

## 2022-12-09 NOTE — Telephone Encounter (Signed)
Transition Care Management Unsuccessful Follow-up Telephone Call  Date of discharge and from where:  12/03/22 from [facility]  Attempts:  2nd Attempt  Reason for unsuccessful TCM follow-up call:  No answer/busy

## 2022-12-12 NOTE — Telephone Encounter (Signed)
Transition Care Management Unsuccessful Follow-up Telephone Call  Date of discharge and from where:  12/03/22 from [facility]  Attempts:  3rd Attempt  Reason for unsuccessful TCM follow-up call:  No answer/busy

## 2022-12-24 ENCOUNTER — Other Ambulatory Visit: Payer: Self-pay | Admitting: Medical-Surgical

## 2023-01-09 ENCOUNTER — Other Ambulatory Visit: Payer: Self-pay | Admitting: Medical-Surgical

## 2023-01-09 DIAGNOSIS — I10 Essential (primary) hypertension: Secondary | ICD-10-CM

## 2023-01-24 ENCOUNTER — Other Ambulatory Visit: Payer: Self-pay | Admitting: Medical-Surgical

## 2023-01-30 ENCOUNTER — Other Ambulatory Visit: Payer: Self-pay | Admitting: Medical-Surgical

## 2023-01-30 DIAGNOSIS — I10 Essential (primary) hypertension: Secondary | ICD-10-CM

## 2023-02-09 ENCOUNTER — Encounter: Payer: Self-pay | Admitting: Medical-Surgical

## 2023-02-09 ENCOUNTER — Ambulatory Visit (INDEPENDENT_AMBULATORY_CARE_PROVIDER_SITE_OTHER): Payer: BC Managed Care – PPO | Admitting: Medical-Surgical

## 2023-02-09 VITALS — BP 125/82 | HR 108 | Resp 20 | Ht 65.0 in | Wt 164.9 lb

## 2023-02-09 DIAGNOSIS — I10 Essential (primary) hypertension: Secondary | ICD-10-CM | POA: Diagnosis not present

## 2023-02-09 DIAGNOSIS — E7849 Other hyperlipidemia: Secondary | ICD-10-CM

## 2023-02-09 MED ORDER — VALSARTAN-HYDROCHLOROTHIAZIDE 320-25 MG PO TABS: ORAL_TABLET | ORAL | 3 refills | Status: DC

## 2023-02-09 NOTE — Progress Notes (Signed)
   Established Patient Office Visit  Subjective   Patient ID: Tammy Graves, female   DOB: 08-03-1966 Age: 57 y.o. MRN: [id number]   Chief Complaint  Patient presents with   Follow-up   MEDICATIONS   HPI Pleasant 57 year old female presenting today for the following:  HTN: taking valsartan-HCTZ320-'25mg'$  daily.  Has been checking her blood pressure regularly and reports that her numbers are good at home.  Following a low-sodium diet.  Exercising regularly.  Has had a couple of episodes of shortness of breath but that he believes these are related to anxiety.  Denies chest pain, lower extremity edema, dizziness, headaches, palpitations, and syncopal episodes.  Hyperlipidemia: Taking pravastatin 20 mg daily but reports that she is not tolerating this well.  She feels that the medication is giving her excessive gassiness, indigestion, and myalgias.  She tried changing with time of day that she took it but this did not make a difference.  She is trying to stick with it because of her cardiovascular risk but reports that she really does not like statin medications.   Objective:    Vitals:   02/09/23 1533  BP: 125/82  Pulse: (!) 108  Resp: 20  Height: '5\' 5"'$  (1.651 m)  Weight: 164 lb 14.4 oz (74.8 kg)  SpO2: 98%  BMI (Calculated): 27.44   Physical Exam Vitals reviewed.  Constitutional:      General: She is not in acute distress.    Appearance: Normal appearance. She is not ill-appearing.  HENT:     Head: Normocephalic and atraumatic.  Cardiovascular:     Rate and Rhythm: Normal rate and regular rhythm.     Pulses: Normal pulses.     Heart sounds: Normal heart sounds.  Pulmonary:     Effort: Pulmonary effort is normal. No respiratory distress.     Breath sounds: Normal breath sounds. No wheezing, rhonchi or rales.  Skin:    General: Skin is warm and dry.  Neurological:     Mental Status: She is alert and oriented to person, place, and time.  Psychiatric:        Mood and Affect:  Mood normal.        Behavior: Behavior normal.        Thought Content: Thought content normal.        Judgment: Judgment normal.   No results found for this or any previous visit (from the past 24 hour(s)).     The ASCVD Risk score (Arnett DK, et al., 2019) failed to calculate for the following reasons:   The valid total cholesterol range is 130 to 320 mg/dL   Assessment & Plan:   1. Familial hyperlipidemia Checking labs as below.  Of note, her liver enzymes were elevated in December.  Recommend trialing pravastatin 20 mg every other day or 10 mg daily to see if this is better tolerated. - COMPLETE METABOLIC PANEL WITH GFR - Lipid panel  2. Essential hypertension Blood pressure looks good today.  Continue valsartan-hydrochlorothiazide 320-25 mg daily as prescribed.  Continue monitoring blood pressure at home with a goal of 130/80.  Continue low-sodium diet and regular intentional exercise.  -Valsartan-hydrochlorothiazide (DIOVAN-HCT) 320-25 MG tablet; TAKE 1 TABLET BY MOUTH DAILY. **[employer] BRAND ONLY**  Dispense: 90 tablet; Refill: 3  Return in about 6 months (around 08/12/2023) for HTN follow up.  ___________________________________________ Clearnce Sorrel, DNP, APRN, FNP-BC Primary Care and [place]

## 2023-02-10 LAB — COMPLETE METABOLIC PANEL WITH GFR
AG Ratio: 1.6 (calc) (ref 1.0–2.5)
ALT: 43 U/L — ABNORMAL HIGH (ref 6–29)
AST: 35 U/L (ref 10–35)
Albumin: 4.5 g/dL (ref 3.6–5.1)
Alkaline phosphatase (APISO): 119 U/L (ref 37–153)
BUN: 18 mg/dL (ref 7–25)
CO2: 28 mmol/L (ref 20–32)
Calcium: 10 mg/dL (ref 8.6–10.4)
Chloride: 101 mmol/L (ref 98–110)
Creat: 0.68 mg/dL (ref 0.50–1.03)
Globulin: 2.9 g/dL (calc) (ref 1.9–3.7)
Glucose, Bld: 94 mg/dL (ref 65–99)
Potassium: 3.4 mmol/L — ABNORMAL LOW (ref 3.5–5.3)
Sodium: 140 mmol/L (ref 135–146)
Total Bilirubin: 0.7 mg/dL (ref 0.2–1.2)
Total Protein: 7.4 g/dL (ref 6.1–8.1)
eGFR: 101 mL/min/{1.73_m2} (ref >=60)

## 2023-02-10 LAB — LIPID PANEL
Cholesterol: 276 mg/dL — ABNORMAL HIGH (ref <200)
HDL: 80 mg/dL (ref >=50)
LDL Cholesterol (Calc): 180 mg/dL (calc) — ABNORMAL HIGH
Non-HDL Cholesterol (Calc): 196 mg/dL (calc) — ABNORMAL HIGH (ref <130)
Total CHOL/HDL Ratio: 3.5 (calc) (ref <5.0)
Triglycerides: 61 mg/dL (ref <150)

## 2023-02-11 ENCOUNTER — Other Ambulatory Visit: Payer: Self-pay | Admitting: Medical-Surgical

## 2023-02-12 ENCOUNTER — Other Ambulatory Visit: Payer: Self-pay | Admitting: Medical-Surgical

## 2023-03-12 ENCOUNTER — Telehealth: Payer: BC Managed Care – PPO | Admitting: Family

## 2023-03-12 DIAGNOSIS — R051 Acute cough: Secondary | ICD-10-CM

## 2023-03-12 MED ORDER — FLUTICASONE PROPIONATE 50 MCG/ACT NA SUSP: 2.0000 | Freq: Every day | NASAL | 6 refills | Status: DC

## 2023-03-12 MED ORDER — BENZONATATE 100 MG PO CAPS: 100.0000 mg | ORAL_CAPSULE | Freq: Three times a day (TID) | ORAL | 0 refills | Status: DC | PRN

## 2023-03-12 MED ORDER — CETIRIZINE HCL 10 MG PO TABS: 10.0000 mg | ORAL_TABLET | Freq: Every day | ORAL | 1 refills | Status: DC

## 2023-03-12 NOTE — Progress Notes (Signed)
E-Visit for Cough  We are sorry that you are not feeling well.  Here is how we plan to help!  Based on your presentation I believe you most likely have A cough due to a virus.  This is called viral bronchitis and is best treated by rest, plenty of fluids and control of the cough.  You may use Ibuprofen or Tylenol as directed to help your symptoms.     In addition you may use A non-prescription cough medication called Robitussin DAC. Take 2 teaspoons every 8 hours or Delsym: take 2 teaspoons every 12 hours., A non-prescription cough medication called Mucinex DM: take 2 tablets every 12 hours., and A prescription cough medication called Tessalon Perles 100mg . You may take 1-2 capsules every 8 hours as needed for your cough. Zyrtec and Flonase to your pharmacy.     From your responses in the eVisit questionnaire you describe inflammation in the upper respiratory tract which is causing a significant cough.  This is commonly called Bronchitis and has four common causes:   Allergies Viral Infections Acid Reflux Bacterial Infection Allergies, viruses and acid reflux are treated by controlling symptoms or eliminating the cause. An example might be a cough caused by taking certain blood pressure medications. You stop the cough by changing the medication. Another example might be a cough caused by acid reflux. Controlling the reflux helps control the cough.  USE OF BRONCHODILATOR ("RESCUE") INHALERS: There is a risk from using your bronchodilator too frequently.  The risk is that over-reliance on a medication which only relaxes the muscles surrounding the breathing tubes can reduce the effectiveness of medications prescribed to reduce swelling and congestion of the tubes themselves.  Although you feel brief relief from the bronchodilator inhaler, your asthma may actually be worsening with the tubes becoming more swollen and filled with mucus.  This can delay other crucial treatments, such as oral steroid  medications. If you need to use a bronchodilator inhaler daily, several times per day, you should discuss this with your provider.  There are probably better treatments that could be used to keep your asthma under control.     HOME CARE Only take medications as instructed by your medical team. Complete the entire course of an antibiotic. Drink plenty of fluids and get plenty of rest. Avoid close contacts especially the very young and the elderly Cover your mouth if you cough or cough into your sleeve. Always remember to wash your hands A steam or ultrasonic humidifier can help congestion.   GET HELP RIGHT AWAY IF: You develop worsening fever. You become short of breath You cough up blood. Your symptoms persist after you have completed your treatment plan MAKE SURE YOU  Understand these instructions. Will watch your condition. Will get help right away if you are not doing well or get worse.    Thank you for choosing an e-visit.  Your e-visit answers were reviewed by a board certified advanced clinical practitioner to complete your personal care plan. Depending upon the condition, your plan could have included both over the counter or prescription medications.  Please review your pharmacy choice. Make sure the pharmacy is open so you can pick up prescription now. If there is a problem, you may contact your provider through [employer] and have the prescription routed to another pharmacy.  Your safety is important to [place]. If you have drug allergies check your prescription carefully.   For the next 24 hours you can use MyChart to ask questions about  today's visit, request a non-urgent call back, or ask for a work or school excuse. You will get an email in the next two days asking about your experience. I hope that your e-visit has been valuable and will speed your recovery.  Approximately 5 minutes was spent documenting and reviewing patient's chart.

## 2023-03-13 ENCOUNTER — Telehealth: Payer: Self-pay | Admitting: General Practice

## 2023-03-13 ENCOUNTER — Encounter: Payer: Self-pay | Admitting: Medical-Surgical

## 2023-03-13 ENCOUNTER — Telehealth (INDEPENDENT_AMBULATORY_CARE_PROVIDER_SITE_OTHER): Payer: BC Managed Care – PPO | Admitting: Medical-Surgical

## 2023-03-13 DIAGNOSIS — J208 Acute bronchitis due to other specified organisms: Secondary | ICD-10-CM | POA: Diagnosis not present

## 2023-03-13 NOTE — Transitions of Care (Post Inpatient/ED Visit) (Signed)
   03/13/2023  Name: Charrie Eiken MRN: [id number] DOB: [birth year]  Today's TOC FU Call Status: Today's TOC FU Call Status:: Successful TOC FU Call Competed TOC FU Call Complete Date: 03/13/23  Transition Care Management Follow-up Telephone Call Date of Discharge: 03/11/23 Discharge Facility: Other ([facility]) Name of Other ([facility]) Discharge Facility: [facility] Type of Discharge: Emergency Department Reason for ED Visit: Other: (hyperkalemia) How have you been since you were released from the hospital?: Better Any questions or concerns?: No  Items Reviewed: Did you receive and understand the discharge instructions provided?: No Medications obtained and verified?: Yes (Medications Reviewed) Any new allergies since your discharge?: No Dietary orders reviewed?: No Do you have support at home?: No  Home Care and Equipment/Supplies: Were Home Health Services Ordered?: No Any new equipment or medical supplies ordered?: No  Functional Questionnaire: Do you need assistance with bathing/showering or dressing?: No Do you need assistance with meal preparation?: No Do you need assistance with eating?: No Do you have difficulty maintaining continence: No Do you need assistance with getting out of bed/getting out of a chair/moving?: No Do you have difficulty managing or taking your medications?: No  Follow up appointments reviewed: PCP Follow-up appointment confirmed?: No MD Provider Line Number:[phone] Given: No Specialist Hospital Follow-up appointment confirmed?: NA Do you need transportation to your follow-up appointment?: No Do you understand care options if your condition(s) worsen?: Yes-patient verbalized understanding    SIGNATURE: Modesto Charon, RN BSN

## 2023-03-13 NOTE — Progress Notes (Signed)
Virtual Visit via Video Note  I connected with Tammy Graves on 03/13/23 at  1:00 PM EDT by a video enabled telemedicine application and verified that I am speaking with the correct person using two identifiers.   I discussed the limitations of evaluation and management by telemedicine and the availability of in person appointments. The patient expressed understanding and agreed to proceed.  Patient location: home Provider locations: office  Subjective:    CC: Cough  HPI: Pleasant 57 year old female presenting via MyChart video visit with reports of approximately 4 days of upper respiratory symptoms.  Notes that she ended up going to the ED on Friday due to severe dizziness and feeling very hot.  The workup there was reassuring and she tested negative for COVID.  Reports that she is having a cough that is productive of small amounts of phlegm.  Has some shortness of breath at times.  No other concerning symptoms including fever, chills, chest pain, facial pressure/pain, and sinus congestion.  She did an e-visit yesterday where they diagnosed her as having acute viral bronchitis.  She was prescribed multiple options available over-the-counter including Delsym, Robitussin, Zyrtec, and Flonase.  She also received a prescription for [facility] which she picked up but has not taken yet.  She was worried that the [employer] may worsen her hypertension or interact with her other medications.   Past medical history, Surgical history, Family history not pertinant except as noted below, Social history, Allergies, and medications have been entered into the medical record, reviewed, and corrections made.   Review of Systems: See HPI for pertinent positives and negatives.   Objective:    General: Speaking clearly in complete sentences without any shortness of breath.  Alert and oriented x3.  Normal judgment. No apparent acute distress.  Impression and Recommendations:    1. Acute viral  bronchitis Discussed the etiology of viral illnesses.  Strongly suspect that after 4 days of symptoms, her presentation is consistent with a viral etiology.  Discussed the benefits of using Tessalon Perles.  Advised that these will not negatively affect her blood pressure and that they should be safe to use as prescribed.  Discussed over-the-counter cold and flu preparations that may be beneficial.  Would like her to look for formulations made specifically for patients with high blood pressure.  Discussed adding Mucinex to help thin secretions.  Also reviewed starting a daily antihistamine such as Zyrtec or Claritin.  Reviewed symptoms to monitor for regarding need for further intervention.  Advised that if her symptoms improve then abruptly worsen, do not improve at all, or drastically worsen over the next few days, she should reach out for further recommendations.  At that point, we will likely err on the side of caution and treat for a bacterial etiology.  Patient is agreeable to the plan and will let me know.  Reports that she plans to try the [employer].  I discussed the assessment and treatment plan with the patient. The patient was provided an opportunity to ask questions and all were answered. The patient agreed with the plan and demonstrated an understanding of the instructions.   The patient was advised to call back or seek an in-person evaluation if the symptoms worsen or if the condition fails to improve as anticipated.  25 minutes of non-face-to-face time was provided during this encounter.  Return if symptoms worsen or fail to improve.  Thayer Ohm, DNP, APRN, FNP-BC [facility] and Sports Medicine

## 2023-03-17 ENCOUNTER — Ambulatory Visit: Payer: BC Managed Care – PPO | Admitting: Medical-Surgical

## 2023-03-22 ENCOUNTER — Encounter: Payer: Self-pay | Admitting: Emergency Medicine

## 2023-03-22 ENCOUNTER — Ambulatory Visit (INDEPENDENT_AMBULATORY_CARE_PROVIDER_SITE_OTHER): Payer: BC Managed Care – PPO

## 2023-03-22 ENCOUNTER — Ambulatory Visit
Admission: EM | Admit: 2023-03-22 | Discharge: 2023-03-22 | Disposition: A | Discharge status: Home / Self Care | Payer: BC Managed Care – PPO | Attending: Family Medicine | Admitting: Family Medicine

## 2023-03-22 DIAGNOSIS — R0602 Shortness of breath: Secondary | ICD-10-CM

## 2023-03-22 DIAGNOSIS — Z8709 Personal history of other diseases of the respiratory system: Secondary | ICD-10-CM

## 2023-03-22 DIAGNOSIS — J209 Acute bronchitis, unspecified: Secondary | ICD-10-CM | POA: Diagnosis not present

## 2023-03-22 DIAGNOSIS — R059 Cough, unspecified: Secondary | ICD-10-CM

## 2023-03-22 MED ORDER — DOXYCYCLINE HYCLATE 100 MG PO CAPS: 100.0000 mg | ORAL_CAPSULE | Freq: Two times a day (BID) | ORAL | 0 refills | Status: DC

## 2023-03-22 MED ORDER — ALBUTEROL SULFATE (2.5 MG/3ML) 0.083% IN NEBU
2.5000 mg | INHALATION_SOLUTION | Freq: Once | RESPIRATORY_TRACT | Status: AC
  Administered 2023-03-22: 2.5 mg via RESPIRATORY_TRACT

## 2023-03-22 MED ORDER — ALBUTEROL SULFATE HFA 108 (90 BASE) MCG/ACT IN AERS: 1.0000 | INHALATION_SPRAY | Freq: Four times a day (QID) | RESPIRATORY_TRACT | 0 refills | Status: DC | PRN

## 2023-03-22 MED ORDER — PREDNISONE 20 MG PO TABS: 40.0000 mg | ORAL_TABLET | Freq: Every day | ORAL | 0 refills | Status: DC

## 2023-03-22 NOTE — Discharge Instructions (Signed)
The antibiotic is doxycycline.  Take 2 times a day.  It is important to take with food The prednisone medicine is 20 mg.  Take 2 pills once a day. I have also prescribed albuterol to use as needed for shortness of breath. Make sure that you are drinking lots of water Call Christen Butter, NP if not improving by next week

## 2023-03-22 NOTE — ED Provider Notes (Signed)
[facility]    CSN: [id number] Arrival date & time: 03/22/23  1153      History   Chief Complaint Chief Complaint  Patient presents with   Shortness of Breath    HPI Tammy Graves is a 57 y.o. female.   HPI  Patient states that she has been sick for 11 to 12 days.  Initially she felt like she was dizzy and weak and went to the emergency room.  She was at the emergency room on 03/11/2023 .  they did a CAT scan of her head, EKG, and lab work.  She states the following day she developed more coughing and viral symptoms.  She did do a COVID test at home that was negative.  She called for a video visit and was diagnosed with viral bronchitis.  Because she was not feeling better she did call her primary care doctor on 03/13/2023.  Confirmed viral bronchitis.  Treated with over-the-counter symptomatic medication. Since then patient states she still feels like she has congestion in her chest.  Her cough is reduced but she is feeling more short of breath.  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  She is here for evaluation.  She had asthma as a child, it feels similar to her asthma. No history of heart disease.  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      Home Medications    Prior to Admission medications   Medication Sig Start Date End Date Taking? Authorizing Provider  albuterol (VENTOLIN HFA) 108 (90 Base) MCG/ACT inhaler Inhale 1-2 puffs into the lungs every 6 (six) hours as needed for wheezing or shortness of breath. 03/22/23  Yes Eustace Moore, MD  doxycycline (VIBRAMYCIN) 100 MG capsule Take 1 capsule (100 mg total) by mouth 2 (two) times daily. 03/22/23  Yes Eustace Moore, MD  predniSONE (DELTASONE) 20 MG tablet Take 2 tablets (40 mg total) by mouth daily with breakfast. 03/22/23  Yes Eustace Moore, MD  aspirin 81 MG chewable tablet Chew by mouth daily.    [provider]  benzonatate (TESSALON PERLES) 100 MG capsule Take 1 capsule (100 mg total) by mouth 3 (three) times daily as needed. Patient not taking: Reported on 03/22/2023 03/12/23   Junie Spencer, FNP  Multiple Vitamin (MULTIVITAMIN WITH MINERALS) TABS tablet Take 1 tablet by mouth daily.    [provider]  pravastatin (PRAVACHOL) 20 MG tablet Take 1 tablet (20 mg total) by mouth  daily. 02/13/23   Christen Butter, NP  valsartan-hydrochlorothiazide (DIOVAN-HCT) 320-25 MG tablet TAKE 1 TABLET BY MOUTH DAILY. **[employer] BRAND ONLY** 02/09/23   Christen Butter, NP    Family History Family History  Problem Relation Age of Onset   Hypertension Father    Ovarian cancer Maternal Grandmother 27   Ovarian cancer Cousin     Social History Social History   Tobacco Use   Smoking status: Never   Smokeless tobacco: Never  Vaping Use   Vaping Use: Never used  Substance Use Topics   Alcohol use: Not Currently   Drug use: Not Currently     Allergies   Codeine and Other   Review of Systems Review of Systems  See HPI Physical  Exam Triage Vital Signs ED Triage Vitals  Enc Vitals Group     BP 03/22/23 1203 (!) 144/90     Pulse Rate 03/22/23 1203 94     Resp 03/22/23 1203 16     Temp 03/22/23 1203 98.3 F (36.8 C)     Temp Source 03/22/23 1203 Oral     SpO2 03/22/23 1203 98 %     Weight 03/22/23 1204 157 lb (71.2 kg)     Height 03/22/23 1204 5\' 5"  (1.651 m)     Head Circumference --      Peak Flow --      Pain Score 03/22/23 1204 0     Pain Loc --      Pain Edu? --      Excl. in GC? --    No data found.  Updated Vital Signs BP (!) 144/90 (BP Location: Left Arm)   Pulse 94   Temp 98.3 F (36.8 C) (Oral)   Resp 16   Ht 5\' 5"  (1.651 m)   Wt 71.2 kg   LMP 08/05/2020 (Approximate) Comment: thinks she going thru menopause  SpO2 98%   BMI 26.13 kg/m        Physical Exam Constitutional:      General: She is not in acute distress.    Appearance: She is well-developed. She is ill-appearing.  HENT:     Head: Normocephalic and atraumatic.  Eyes:     Conjunctiva/sclera: Conjunctivae normal.     Pupils: Pupils are equal, round, and reactive to light.  Cardiovascular:     Rate and Rhythm: Normal rate.  Pulmonary:     Effort: Pulmonary effort is normal. No respiratory distress.     Breath sounds: Examination of the left-lower field reveals decreased breath sounds. Decreased breath sounds present. No wheezing, rhonchi or rales.  Abdominal:     General: There is no distension.     Palpations: Abdomen is soft.  Musculoskeletal:        General: Normal range of motion.     Cervical back: Normal range of motion.  Skin:    General: Skin is warm and dry.  Neurological:     Mental Status: She is alert.      UC Treatments / Results  Labs (all labs ordered are listed, but only abnormal results are displayed) Labs Reviewed - No data to display  EKG   Radiology DG Chest 2 View  Result Date: 03/22/2023 CLINICAL DATA:  Cough and shortness of breath. Symptoms for 11 days. EXAM: CHEST - 2 VIEW  COMPARISON:  None Available. FINDINGS: The heart size and mediastinal contours are within normal limits. Both lungs are clear. The visualized skeletal structures are unremarkable. Surgical clips overlie the LEFT breast. IMPRESSION: No  active cardiopulmonary disease. Electronically Signed   By: Norva Pavlov M.D.   On: 03/22/2023 12:35    Procedures Procedures (including critical care time)  Medications Ordered in UC Medications  albuterol (PROVENTIL) (2.5 MG/3ML) 0.083% nebulizer solution 2.5 mg (2.5 mg Nebulization Given 03/22/23 1245)    Initial Impression / Assessment and Plan / UC Course  I have reviewed the triage vital signs and the nursing notes.  Pertinent labs & imaging results that were available during my care of the patient were reviewed by me and considered in my medical decision making (see chart for details).     As I was discussing discharge with patient I told her I was going to give her steroids and an antibiotic for her respiratory infection.  I told although I likely started off a virus, it is persisting longer than it did.  She queries whether she needs an albuterol inhaler.  I told her I do not hear any wheezing.  She requested a trial of a albuterol nebulized treatment.  This was provided Final Clinical Impressions(s) / UC Diagnoses   Final diagnoses:  Acute bronchitis, unspecified organism  SOB (shortness of breath)  History of asthma     Discharge Instructions      The antibiotic is doxycycline.  Take 2 times a day.  It is important to take with food The prednisone medicine is 20 mg.  Take 2 pills once a day. I have also prescribed albuterol to use as needed for shortness of breath. Make sure that you are drinking lots of water Call Christen Butter, NP if not improving by next week     ED Prescriptions     Medication Sig Dispense Auth. Provider   albuterol (VENTOLIN HFA) 108 (90 Base) MCG/ACT inhaler Inhale 1-2 puffs into the lungs every 6 (six) hours  as needed for wheezing or shortness of breath. 18 g Eustace Moore, MD   doxycycline (VIBRAMYCIN) 100 MG capsule Take 1 capsule (100 mg total) by mouth 2 (two) times daily. 14 capsule Eustace Moore, MD   predniSONE (DELTASONE) 20 MG tablet Take 2 tablets (40 mg total) by mouth daily with breakfast. 10 tablet Eustace Moore, MD      PDMP not reviewed this encounter.   Eustace Moore, MD 03/22/23 [phone]

## 2023-03-22 NOTE — ED Triage Notes (Signed)
Pt   continues to have SOB Normally does not have seasonal allergies  Today the SOB was worse after she was outside Childhood asthma Here with her son

## 2023-04-03 ENCOUNTER — Ambulatory Visit: Payer: BC Managed Care – PPO | Admitting: Medical-Surgical

## 2023-04-04 ENCOUNTER — Ambulatory Visit: Payer: BC Managed Care – PPO | Admitting: Medical-Surgical

## 2023-04-04 ENCOUNTER — Encounter: Payer: Self-pay | Admitting: Medical-Surgical

## 2023-04-04 VITALS — BP 124/84 | HR 94 | Resp 20 | Ht 65.0 in | Wt 163.7 lb

## 2023-04-04 DIAGNOSIS — J4520 Mild intermittent asthma, uncomplicated: Secondary | ICD-10-CM | POA: Diagnosis not present

## 2023-04-04 DIAGNOSIS — I10 Essential (primary) hypertension: Secondary | ICD-10-CM

## 2023-04-04 DIAGNOSIS — R0789 Other chest pain: Secondary | ICD-10-CM | POA: Diagnosis not present

## 2023-04-04 MED ORDER — PANTOPRAZOLE SODIUM 20 MG PO TBEC: 20.0000 mg | DELAYED_RELEASE_TABLET | Freq: Every day | ORAL | 1 refills | Status: DC

## 2023-04-04 MED ORDER — BUDESONIDE-FORMOTEROL FUMARATE 80-4.5 MCG/ACT IN AERO: 2.0000 | INHALATION_SPRAY | Freq: Two times a day (BID) | RESPIRATORY_TRACT | 3 refills | Status: DC

## 2023-04-04 NOTE — Progress Notes (Signed)
        Established patient visit  History, exam, impression, and plan:  1. Essential hypertension Pleasant 57 year old female presenting with a history of HTN treated with Valsartan-HCTZ 320-25mg  daily. Tolerating the medication well without side effects. Following a low sodium diet and staying active. BP at goal. No concerning symptoms. HRR, S1/S2 normal. Lungs CTA. Continue Valsartan-HCTZ as prescribed.   2. Childhood asthma, mild intermittent, uncomplicated 3. Chest tightness Reports a history of asthma in childhood that she grew out of. Recently has issues with viral bronchitis that lingered. Was prescribed doxycycline and prednisone by UC which she completed. Also given an albuterol inhaler that she has been using prn. Reports that most of her s/s have resolved but she has a lingering chest tightness in the lower chest. When asked to pinpoint the area, points to the epigastric region just below the xyphoid process. Feels that she needs to take deep breaths periodically to resolve the issue. The albuterol has not been helping much. CXR completed by UC normal. No CP, nausea, vomiting, diaphoresis, cough, fever, chills, or wheezing. Lungs CTA, resps even and unlabored. HRR, S1/S2 normal. No peripheral edema. O2 sat normal on RA. Adding Symbicort 2 puffs twice daily for 2 weeks to see if this provides benefit. Also adding Protonix 20mg  daily for the next couple of weeks to tackle potential reflux. Ok to continue albuterol prn if desired.   Procedures performed this visit: None.  Return if symptoms worsen or fail to improve.  __________________________________ Thayer Ohm, DNP, APRN, FNP-BC Primary Care and Sports Medicine [facility] [place]

## 2023-04-30 ENCOUNTER — Other Ambulatory Visit: Payer: Self-pay | Admitting: Medical-Surgical

## 2023-07-13 ENCOUNTER — Encounter: Payer: Self-pay | Admitting: Medical-Surgical

## 2023-07-18 ENCOUNTER — Encounter: Payer: Self-pay | Admitting: Medical-Surgical

## 2023-07-21 ENCOUNTER — Telehealth: Payer: Self-pay | Admitting: Medical-Surgical

## 2023-07-21 NOTE — Telephone Encounter (Signed)
Patient called got exposed to COVID got home from work took a test and tested positive she is requesting medication to be called in to her pharmacy  [facility] [place] [phone]

## 2023-07-21 NOTE — Telephone Encounter (Signed)
Patient needs a virtual appointment or go to Urgent Care.  Thank you.

## 2023-07-24 NOTE — Telephone Encounter (Signed)
Called patient she is scheduled for a virtual appointment on Tuesday August 20th

## 2023-07-25 ENCOUNTER — Encounter: Payer: BC Managed Care – PPO | Admitting: Medical-Surgical

## 2023-07-25 ENCOUNTER — Encounter: Payer: Self-pay | Admitting: Medical-Surgical

## 2023-07-25 ENCOUNTER — Telehealth (INDEPENDENT_AMBULATORY_CARE_PROVIDER_SITE_OTHER): Payer: BC Managed Care – PPO | Admitting: Medical-Surgical

## 2023-07-25 DIAGNOSIS — U071 COVID-19: Secondary | ICD-10-CM | POA: Diagnosis not present

## 2023-07-25 MED ORDER — NIRMATRELVIR/RITONAVIR (PAXLOVID)TABLET: 3.0000 | ORAL_TABLET | Freq: Two times a day (BID) | ORAL | 0 refills | Status: AC

## 2023-07-25 MED ORDER — DEXAMETHASONE 4 MG PO TABS: 4.0000 mg | ORAL_TABLET | Freq: Two times a day (BID) | ORAL | 0 refills | Status: DC

## 2023-07-25 MED ORDER — ALBUTEROL SULFATE HFA 108 (90 BASE) MCG/ACT IN AERS: 2.0000 | INHALATION_SPRAY | Freq: Four times a day (QID) | RESPIRATORY_TRACT | 0 refills | Status: DC | PRN

## 2023-07-25 NOTE — Progress Notes (Signed)
Virtual Visit via Video Note  I connected with Tammy Graves on 07/25/23 at  4:00 PM EDT by a video enabled telemedicine application and verified that I am speaking with the correct person using two identifiers.   I discussed the limitations of evaluation and management by telemedicine and the availability of in person appointments. The patient expressed understanding and agreed to proceed.  Patient location: home Provider locations: office  Subjective:    CC: COVID-19 positive  HPI: Pleasant 57 year old female presenting via MyChart video visit with reports of testing positive for COVID on Friday of last week.  Initial symptoms started with rhinorrhea on Thursday.  She heard that a coworker had tested positive for COVID so decided to run a test on herself with positive results.  Since then she has developed a mild cough productive of small amounts of light yellow thick mucus, significant sinus congestion, headache, difficulty sleeping, and mild shortness of breath last night.  Denies fever, chills, body aches, sore throat, nausea, vomiting, and diarrhea.  Eating and drinking with no problem.  Has tried Coricidin which helped a little bit with her symptoms.  Past medical history, Surgical history, Family history not pertinant except as noted below, Social history, Allergies, and medications have been entered into the medical record, reviewed, and corrections made.   Review of Systems: See HPI for pertinent positives and negatives.   Objective:    General: Speaking clearly in complete sentences without any shortness of breath.  Alert and oriented x3.  Normal judgment. No apparent acute distress.  Impression and Recommendations:    1. COVID-19 virus infection Reviewed conservative measures for treatment of COVID.  Discussed the use of oral antivirals.  She is at the very end of her 5 days of symptoms so unsure about the benefit but she would like to see if its covered by her insurance.   Sending in Paxlovid twice daily x 5 days.  Adding Decadron 4 mg twice daily x 5 days.  Sending in an albuterol inhaler to help with shortness of breath.  Okay to continue over-the-counter Coricidin or use Tylenol/ibuprofen as needed.  Discussed CDC recommendations for quarantine.  I discussed the assessment and treatment plan with the patient. The patient was provided an opportunity to ask questions and all were answered. The patient agreed with the plan and demonstrated an understanding of the instructions.   The patient was advised to call back or seek an in-person evaluation if the symptoms worsen or if the condition fails to improve as anticipated.  25 minutes of non-face-to-face time was provided during this encounter.  Return if symptoms worsen or fail to improve.  Thayer Ohm, DNP, APRN, FNP-BC [facility] and Sports Medicine

## 2023-07-25 NOTE — Progress Notes (Unsigned)
Virtual Visit via Video Note  I connected with Tammy Graves on 07/25/23 at 10:30 AM EDT by a video enabled telemedicine application and verified that I am speaking with the correct person using two identifiers.   I discussed the limitations of evaluation and management by telemedicine and the availability of in person appointments. The patient expressed understanding and agreed to proceed.  Patient location: home Provider locations: office  Subjective:    CC: Tested positive for COVID  HPI:    Past medical history, Surgical history, Family history not pertinant except as noted below, Social history, Allergies, and medications have been entered into the medical record, reviewed, and corrections made.   Review of Systems: See HPI for pertinent positives and negatives.   Objective:    General: Speaking clearly in complete sentences without any shortness of breath.  Alert and oriented x3.  Normal judgment. No apparent acute distress.  Impression and Recommendations:    No problem-specific Assessment & Plan notes found for this encounter.   I discussed the assessment and treatment plan with the patient. The patient was provided an opportunity to ask questions and all were answered. The patient agreed with the plan and demonstrated an understanding of the instructions.   The patient was advised to call back or seek an in-person evaluation if the symptoms worsen or if the condition fails to improve as anticipated.  *** minutes of non-face-to-face time was provided during this encounter.  No follow-ups on file.  Thayer Ohm, DNP, APRN, FNP-BC [facility]

## 2023-08-16 ENCOUNTER — Other Ambulatory Visit: Payer: Self-pay | Admitting: Medical-Surgical

## 2023-08-31 ENCOUNTER — Other Ambulatory Visit: Payer: Self-pay | Admitting: Medical-Surgical

## 2024-01-04 ENCOUNTER — Other Ambulatory Visit: Payer: Self-pay | Admitting: Medical-Surgical

## 2024-01-04 DIAGNOSIS — I10 Essential (primary) hypertension: Secondary | ICD-10-CM

## 2024-01-05 NOTE — Telephone Encounter (Signed)
Pls contact the pt to schedule HTN follow-up with [facility] due by March. Sending 30 day refill. Thanks

## 2024-01-05 NOTE — Telephone Encounter (Signed)
Called left vm to call back and schedule her appt for HTN and in March made aware a 30 day refill has been submitted

## 2024-01-10 ENCOUNTER — Telehealth: Payer: Self-pay

## 2024-01-10 NOTE — Telephone Encounter (Signed)
 Copied from CRM [id number]. Topic: Clinical - Prescription Issue >> Jan 05, 2024  4:24 PM Ivette P wrote: Reason for CRM: Pt received a notificaotn to pick up prescription. Pt said another order was put in. Pt just picked up prescription not too long ago. Wanted to notify that she did not need a refill for 01/31    valsartan -hydrochlorothiazide  (DIOVAN -HCT) 320-25 MG tablet

## 2024-02-13 ENCOUNTER — Ambulatory Visit (INDEPENDENT_AMBULATORY_CARE_PROVIDER_SITE_OTHER): Payer: Self-pay | Admitting: Medical-Surgical

## 2024-02-13 ENCOUNTER — Encounter: Payer: Self-pay | Admitting: Medical-Surgical

## 2024-02-13 VITALS — BP 136/93 | HR 109 | Resp 20 | Ht 65.0 in | Wt 170.6 lb

## 2024-02-13 DIAGNOSIS — Z1231 Encounter for screening mammogram for malignant neoplasm of breast: Secondary | ICD-10-CM | POA: Diagnosis not present

## 2024-02-13 DIAGNOSIS — I10 Essential (primary) hypertension: Secondary | ICD-10-CM | POA: Diagnosis not present

## 2024-02-13 DIAGNOSIS — E7849 Other hyperlipidemia: Secondary | ICD-10-CM | POA: Diagnosis not present

## 2024-02-13 MED ORDER — VALSARTAN-HYDROCHLOROTHIAZIDE 320-25 MG PO TABS
ORAL_TABLET | ORAL | 3 refills | Status: DC
Start: 2024-02-13 — End: 2024-03-26

## 2024-02-13 MED ORDER — PRAVASTATIN SODIUM 20 MG PO TABS: 20.0000 mg | ORAL_TABLET | Freq: Every day | ORAL | 3 refills | Status: DC

## 2024-02-13 NOTE — Progress Notes (Signed)
 Subjective:  Patient ID: Tammy Graves, female    DOB: 1966/07/31, 58 y.o.   MRN: [id number]  Patient Care Team: Christen Butter, NP as PCP - General (Nurse Practitioner) Pershing Proud, RN as Oncology Nurse Navigator Donnelly Angelica, RN as Oncology Nurse Navigator Griselda Miner, MD as Consulting Physician (General Surgery) Magrinat, Valentino Hue, MD (Inactive) as Consulting Physician (Oncology) Antony Blackbird, MD as Consulting Physician (Radiation Oncology)   Chief Complaint:  Hypertension   HPI:  Tammy Graves is a 58 y.o. female presenting on 02/13/2024 for Hypertension   History, Exam,  Impression and Plan   1. Essential hypertension (Primary) Patient takes valsartan hydrochlorothiazide 320-25 mg daily as directed without complication.  Denies chest pain, dizziness, blurry vision, palpitations, or lower extremity edema. Patient home blood pressures running 110-120s systolic and 80s diastolic.  Initial blood pressure this visit mildly elevated at 136/93 with heart rate of 109.  Recheck of BP was 128/83 with heart rate of 90. Patient has not had labs drawn since 02/09/2023.  - CBC with Differential/Platelet - CMP14+EGFR - Lipid panel - valsartan-hydrochlorothiazide (DIOVAN-HCT) 320-25 MG tablet; TAKE 1 TABLET BY MOUTH DAILY. **MACLEOD BRAND ONLY**  Dispense: 90 tablet; Refill: 3  2. 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 Underwent left lumpectomy on 11/06/2020 removed a 2cm tumor.  Declined radiation or chemo therapy. Last follow-up with oncology was 02/10/2021.  Patient not interested in mammogram at this time.   3. Familial hyperlipidemia Last lipid panel showed cholesterol of 276 HDL of 80 LDL of 180 and triglycerides of 61.  Patient is taking pravastatin daily as directed without complication denies myalgia or bodyaches.  Continue pravastatin.. - CMP14+EGFR - Lipid  panel - pravastatin (PRAVACHOL) 20 MG tablet; Take 1 tablet (20 mg total) by mouth daily.  Dispense: 90 tablet; Refill: 3      Continue all other maintenance medications.  Follow up plan: Return in about 6 months (around 08/15/2024) for HTN follow up.   Relevant past medical, surgical, family, and social history reviewed and updated as indicated.  Allergies and medications reviewed and updated. Data reviewed: Chart in Epic.   Past Medical History:  Diagnosis Date   Anxiety    Cancer (HCC) 10/2020   left breast DCIS   Depression    Essential hypertension 01/16/2020   Family history of ovarian cancer    GERD (gastroesophageal reflux disease)    OTC meds   History of pre-eclampsia 01/16/2020   History of pre-eclampsia 01/16/2020   Hypertension     Past Surgical History:  Procedure Laterality Date   BREAST LUMPECTOMY WITH RADIOACTIVE SEED LOCALIZATION Left 11/06/2020   Procedure: LEFT BREAST LUMPECTOMY WITH RADIOACTIVE SEED LOCALIZATION;  Surgeon: Griselda Miner, MD;  Location: [facility];  Service: General;  Laterality: Left;    Social History   Socioeconomic History   Marital status: Single    Spouse name: Not on file   Number of children: Not on file   Years of education: Not on file   Highest education level: Not on file  Occupational History   Occupation: Teacher    Employer: [employer]  Tobacco Use   Smoking status: Never   Smokeless tobacco: Never  Vaping Use   Vaping status: Never Used  Substance and Sexual Activity   Alcohol use: Not Currently   Drug use: Not Currently   Sexual activity: Not Currently  Partners: Male  Other Topics Concern   Not on file  Social History Narrative   Not on file   Social Drivers of Health   Financial Resource Strain: Not on file  Food Insecurity: Not on file  Transportation Needs: Not on file  Physical Activity: Not on file  Stress: Not on file  Social Connections: Unknown (04/16/2022)   Received  from [facility], [facility]   Social Network    Social Network: Not on file  Intimate Partner Violence: Not At Risk (03/11/2023)   Received from [facility], [facility]   HITS    Over the last 12 months how often did your partner physically hurt you?: Never    Over the last 12 months how often did your partner insult you or talk down to you?: Never    Over the last 12 months how often did your partner threaten you with physical harm?: Never    Over the last 12 months how often did your partner scream or curse at you?: Never    Outpatient Encounter Medications as of 02/13/2024  Medication Sig   albuterol (VENTOLIN HFA) 108 (90 Base) MCG/ACT inhaler TAKE 2 PUFFS BY MOUTH EVERY 6 HOURS AS NEEDED FOR WHEEZE OR SHORTNESS OF BREATH   aspirin 81 MG chewable tablet Chew by mouth daily.   budesonide-formoterol (SYMBICORT) 80-4.5 MCG/ACT inhaler Inhale 2 puffs into the lungs 2 (two) times daily.   dexamethasone (DECADRON) 4 MG tablet Take 1 tablet (4 mg total) by mouth 2 (two) times daily with a meal.   Multiple Vitamin (MULTIVITAMIN WITH MINERALS) TABS tablet Take 1 tablet by mouth daily.   pantoprazole (PROTONIX) 20 MG tablet TAKE 1 TABLET BY MOUTH EVERY DAY   [DISCONTINUED] pravastatin (PRAVACHOL) 20 MG tablet TAKE 1 TABLET BY MOUTH EVERY DAY   [DISCONTINUED] valsartan-hydrochlorothiazide (DIOVAN-HCT) 320-25 MG tablet TAKE 1 TABLET BY MOUTH DAILY. **MACLEOD BRAND ONLY**   pravastatin (PRAVACHOL) 20 MG tablet Take 1 tablet (20 mg total) by mouth daily.   valsartan-hydrochlorothiazide (DIOVAN-HCT) 320-25 MG tablet TAKE 1 TABLET BY MOUTH DAILY. **MACLEOD BRAND ONLY**   No facility-administered encounter medications on file as of 02/13/2024.    Allergies  Allergen Reactions   Codeine Nausea And Vomiting   Other     tomatoes and OJ    Review of Systems      Objective:  BP (!) 136/93 (BP Location: Left Arm, Cuff Size: Normal)   Pulse (!) 109   Resp 20   Ht 5\' 5"  (1.651 m)   Wt  170 lb 9.6 oz (77.4 kg)   LMP 08/05/2020 (Approximate) Comment: thinks she going thru menopause  SpO2 98%   BMI 28.39 kg/m    Wt Readings from Last 3 Encounters:  02/13/24 170 lb 9.6 oz (77.4 kg)  04/04/23 163 lb 11.2 oz (74.3 kg)  03/22/23 157 lb (71.2 kg)    Physical Exam  Results for orders placed or performed in visit on 02/09/23  COMPLETE METABOLIC PANEL WITH GFR   Collection Time: 02/09/23  3:57 PM  Result Value Ref Range   Glucose, Bld 94 65 - 99 mg/dL   BUN 18 7 - 25 mg/dL   Creat 1.61 0.96 - 0.45 mg/dL   eGFR 409 > OR = 60 WJ/XBJ/4.78G9   BUN/Creatinine Ratio SEE NOTE: 6 - 22 (calc)   Sodium 140 135 - 146 mmol/L   Potassium 3.4 (L) 3.5 - 5.3 mmol/L   Chloride 101 98 - 110 mmol/L   CO2 28  20 - 32 mmol/L   Calcium 10.0 8.6 - 10.4 mg/dL   Total Protein 7.4 6.1 - 8.1 g/dL   Albumin 4.5 3.6 - 5.1 g/dL   Globulin 2.9 1.9 - 3.7 g/dL (calc)   AG Ratio 1.6 1.0 - 2.5 (calc)   Total Bilirubin 0.7 0.2 - 1.2 mg/dL   Alkaline phosphatase (APISO) 119 37 - 153 U/L   AST 35 10 - 35 U/L   ALT 43 (H) 6 - 29 U/L  Lipid panel   Collection Time: 02/09/23  3:57 PM  Result Value Ref Range   Cholesterol 276 (H) <200 mg/dL   HDL 80 > OR = 50 mg/dL   Triglycerides 61 <161 mg/dL   LDL Cholesterol (Calc) 180 (H) mg/dL (calc)   Total CHOL/HDL Ratio 3.5 <5.0 (calc)   Non-HDL Cholesterol (Calc) 196 (H) <130 mg/dL (calc)       Pertinent labs & imaging results that were available during my care of the patient were reviewed by me and considered in my medical decision making.   Continue healthy lifestyle choices, including diet (rich in fruits, vegetables, and lean proteins, and low in salt and simple carbohydrates) and exercise (at least 30 minutes of moderate physical activity daily).   The above assessment and management plan was discussed with the patient. The patient verbalized understanding of and has agreed to the management plan. Patient is aware to call the clinic if they develop  any new symptoms or if symptoms persist or worsen. Patient is aware when to return to the clinic for a follow-up visit. Patient educated on when it is appropriate to go to the emergency department.   Maryelizabeth Kaufmann Student AGNP

## 2024-02-13 NOTE — Progress Notes (Signed)
 Medical screening examination/treatment was performed by qualified clinical staff member and as supervising provider I was immediately available for consultation/collaboration. I have reviewed documentation and agree with assessment and plan.  Addendum: Discussed recommendations for health maintenance with patient.  She remains quite anxious regarding her overall health and has a lot of fear surrounding health maintenance activities.  Reviewed recommendations for colonoscopy, mammogram, pneumonia vaccine, and cervical cancer screening.  All of these were declined today.  We will continue to offer these opportunities in the future should she change her mind.  Blood pressure not quite at goal on arrival.  We did recheck her blood pressure however it was immediately after venipuncture and she was understandably anxious.  Her reading was in the 160s/90s however felt this was to be inaccurate.  Her home readings have been at goal and she is monitoring regularly.  Plan to continue her current medications without changes.  Advised to monitor at home with a goal of 130/80 or less and if consistently higher, return for further evaluation and medication management.  Patient verbalized understanding and is agreeable to the plan.  Thayer Ohm, DNP, APRN, FNP-BC [facility]

## 2024-02-14 ENCOUNTER — Encounter: Payer: Self-pay | Admitting: Medical-Surgical

## 2024-02-14 LAB — CBC WITH DIFFERENTIAL/PLATELET
Basophils Absolute: 0 10*3/uL (ref 0.0–0.2)
Basos: 1 %
EOS (ABSOLUTE): 0.2 10*3/uL (ref 0.0–0.4)
Eos: 5 %
Hematocrit: 38.1 % (ref 34.0–46.6)
Hemoglobin: 12.8 g/dL (ref 11.1–15.9)
Immature Grans (Abs): 0 10*3/uL (ref 0.0–0.1)
Immature Granulocytes: 1 %
Lymphocytes Absolute: 1.7 10*3/uL (ref 0.7–3.1)
Lymphs: 41 %
MCH: 30.9 pg (ref 26.6–33.0)
MCHC: 33.6 g/dL (ref 31.5–35.7)
MCV: 92 fL (ref 79–97)
Monocytes Absolute: 0.4 10*3/uL (ref 0.1–0.9)
Monocytes: 9 %
Neutrophils Absolute: 1.8 10*3/uL (ref 1.4–7.0)
Neutrophils: 43 %
Platelets: 271 10*3/uL (ref 150–450)
RBC: 4.14 x10E6/uL (ref 3.77–5.28)
RDW: 12.2 % (ref 11.7–15.4)
WBC: 4.1 10*3/uL (ref 3.4–10.8)

## 2024-02-14 LAB — CMP14+EGFR
ALT: 45 IU/L — ABNORMAL HIGH (ref 0–32)
AST: 40 IU/L (ref 0–40)
Albumin: 4.4 g/dL (ref 3.8–4.9)
Alkaline Phosphatase: 125 IU/L — ABNORMAL HIGH (ref 44–121)
BUN/Creatinine Ratio: 20 (ref 9–23)
BUN: 14 mg/dL (ref 6–24)
Bilirubin Total: 0.4 mg/dL (ref 0.0–1.2)
CO2: 25 mmol/L (ref 20–29)
Calcium: 9.7 mg/dL (ref 8.7–10.2)
Chloride: 101 mmol/L (ref 96–106)
Creatinine, Ser: 0.71 mg/dL (ref 0.57–1.00)
Globulin, Total: 2.8 g/dL (ref 1.5–4.5)
Glucose: 99 mg/dL (ref 70–99)
Potassium: 3.6 mmol/L (ref 3.5–5.2)
Sodium: 141 mmol/L (ref 134–144)
Total Protein: 7.2 g/dL (ref 6.0–8.5)
eGFR: 98 mL/min/{1.73_m2} (ref >59)

## 2024-02-14 LAB — LIPID PANEL
Chol/HDL Ratio: 4.3 ratio (ref 0.0–4.4)
Cholesterol, Total: 274 mg/dL — ABNORMAL HIGH (ref 100–199)
HDL: 64 mg/dL (ref >39)
LDL Chol Calc (NIH): 186 mg/dL — ABNORMAL HIGH (ref 0–99)
Triglycerides: 135 mg/dL (ref 0–149)
VLDL Cholesterol Cal: 24 mg/dL (ref 5–40)

## 2024-02-16 ENCOUNTER — Ambulatory Visit: Payer: Self-pay | Admitting: Medical-Surgical

## 2024-03-10 ENCOUNTER — Ambulatory Visit
Admission: RE | Admit: 2024-03-10 | Discharge: 2024-03-10 | Disposition: A | Discharge status: Home / Self Care | Source: Ambulatory Visit | Attending: Family Medicine | Admitting: Family Medicine

## 2024-03-10 ENCOUNTER — Other Ambulatory Visit: Payer: Self-pay

## 2024-03-10 VITALS — BP 154/101 | HR 101 | Temp 98.7°F | Resp 18 | Ht 65.0 in | Wt 160.0 lb

## 2024-03-10 DIAGNOSIS — M79602 Pain in left arm: Secondary | ICD-10-CM

## 2024-03-10 MED ORDER — IBUPROFEN 800 MG PO TABS: 800.0000 mg | ORAL_TABLET | Freq: Every day | ORAL | 0 refills | Status: DC | PRN

## 2024-03-10 NOTE — ED Provider Notes (Signed)
 [facility]    CSN: [id number] Arrival date & time: 03/10/24  1107      History   Chief Complaint Chief Complaint  Patient presents with   Arm Pain    No injury, just radiating arm pain. - Entered by patient    HPI Tammy Graves is a 58 y.o. female.   HPI 58 year old female presents with left forearm pain x 1 day.  Patient reports stabbing first thing comes and goes.  PMH significant for left breast cancer, HTN, and anxiety.  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      Home Medications    Prior to Admission medications   Medication Sig Start Date End Date Taking? Authorizing Provider  albuterol (VENTOLIN HFA) 108 (90 Base) MCG/ACT inhaler TAKE 2 PUFFS BY MOUTH EVERY 6 HOURS AS NEEDED FOR WHEEZE OR SHORTNESS OF BREATH 08/21/23  Yes Christen Butter, NP  aspirin 81 MG chewable tablet Chew by mouth daily.   Yes [provider]  budesonide-formoterol (SYMBICORT) 80-4.5 MCG/ACT inhaler Inhale 2 puffs into the lungs 2 (two) times daily. 04/04/23  Yes Christen Butter, NP  ibuprofen (ADVIL) 800 MG tablet Take 1 tablet (800  mg total) by mouth daily as needed. 03/10/24  Yes Trevor Iha, FNP  Multiple Vitamin (MULTIVITAMIN WITH MINERALS) TABS tablet Take 1 tablet by mouth daily.   Yes [provider]  pantoprazole (PROTONIX) 20 MG tablet TAKE 1 TABLET BY MOUTH EVERY DAY 05/02/23  Yes Christen Butter, NP  pravastatin (PRAVACHOL) 20 MG tablet Take 1 tablet (20 mg total) by mouth daily. 02/13/24  Yes Jessup, Joy, NP  valsartan-hydrochlorothiazide (DIOVAN-HCT) 320-25 MG tablet TAKE 1 TABLET BY MOUTH DAILY. **MACLEOD BRAND ONLY** 02/13/24  Yes Christen Butter, NP  dexamethasone (DECADRON) 4 MG tablet Take 1 tablet (4 mg total) by mouth 2 (two) times daily with a meal. 07/25/23   Christen Butter, NP    Family History Family History  Problem Relation Age of Onset   Hypertension Father    Ovarian cancer Maternal Grandmother 17   Ovarian cancer Cousin     Social History Social History   Tobacco Use   Smoking status: Never   Smokeless tobacco: Never  Vaping Use   Vaping status: Never Used  Substance Use Topics   Alcohol use: Not Currently   Drug use: Not Currently     Allergies   Codeine and Other   Review of Systems Review of Systems  Musculoskeletal:        Left arm pain x 1 day     Physical Exam Triage  Vital Signs ED Triage Vitals  Encounter Vitals Group     BP 03/10/24 1132 (!) 154/101     Systolic BP Percentile --      Diastolic BP Percentile --      Pulse Rate 03/10/24 1132 (!) 101     Resp 03/10/24 1132 18     Temp 03/10/24 1132 98.7 F (37.1 C)     Temp Source 03/10/24 1132 Oral     SpO2 03/10/24 1132 97 %     Weight 03/10/24 1131 160 lb (72.6 kg)     Height 03/10/24 1131 5\' 5"  (1.651 m)     Head Circumference --      Peak Flow --      Pain Score 03/10/24 1131 6     Pain Loc --      Pain Education --      Exclude from Growth Chart --    No data found.  Updated Vital Signs BP (!) 154/101 (BP Location: Right Arm)   Pulse (!) 101   Temp 98.7 F (37.1 C) (Oral)   Resp 18   Ht 5'  5" (1.651 m)   Wt 160 lb (72.6 kg)   LMP 08/05/2020 (Approximate) Comment: thinks she going thru menopause  SpO2 97%   BMI 26.63 kg/m       Physical Exam Vitals and nursing note reviewed.  Constitutional:      Appearance: Normal appearance. She is normal weight.  HENT:     Head: Normocephalic and atraumatic.     Mouth/Throat:     Mouth: Mucous membranes are moist.     Pharynx: Oropharynx is clear.  Eyes:     Extraocular Movements: Extraocular movements intact.     Conjunctiva/sclera: Conjunctivae normal.     Pupils: Pupils are equal, round, and reactive to light.  Cardiovascular:     Rate and Rhythm: Normal rate and regular rhythm.     Pulses: Normal pulses.     Heart sounds: Normal heart sounds.  Pulmonary:     Effort: Pulmonary effort is normal.     Breath sounds: Normal breath sounds. No wheezing, rhonchi or rales.  Musculoskeletal:        General: Normal range of motion.     Cervical back: Normal range of motion and neck supple.  Skin:    General: Skin is warm and dry.  Neurological:     General: No focal deficit present.     Mental Status: She is alert and oriented to person, place, and time. Mental status is at baseline.  Psychiatric:        Mood and Affect: Mood normal.        Behavior: Behavior normal.      UC Treatments / Results  Labs (all labs ordered are listed, but only abnormal results are displayed) Labs Reviewed - No data to display  EKG   Radiology No results found.  Procedures Procedures (including critical care time)  Medications Ordered in UC Medications - No data to display  Initial Impression / Assessment and Plan / UC Course  I have reviewed the triage vital signs and the nursing notes.  Pertinent labs & imaging results that were available during my care of the patient were reviewed by me and considered in my medical decision making (see chart for details).     Left arm pain-Rx'd Ibuprofen 800 mg tablet: Take 1 tablet daily,  as needed for left arm pain. Advised patient take medication as directed with food as  needed.  Encouraged increase daily water intake to 64 ounces per day while taking these medications.  Advised if symptoms worsen and/or unresolved please follow-up with your PCP or here for further evaluation. Final Clinical Impressions(s) / UC Diagnoses   Final diagnoses:  Left arm pain     Discharge Instructions      Advised patient take medication as directed with food as needed.  Encouraged increase daily water intake to 64 ounces per day while taking these medications.  Advised if symptoms worsen and/or unresolved please follow-up with your PCP or here for further evaluation.     ED Prescriptions     Medication Sig Dispense Auth. Provider   ibuprofen (ADVIL) 800 MG tablet Take 1 tablet (800 mg total) by mouth daily as needed. 21 tablet Trevor Iha, FNP      PDMP not reviewed this encounter.   Trevor Iha, FNP 03/10/24 1223

## 2024-03-10 NOTE — Discharge Instructions (Addendum)
 Advised patient take medication as directed with food as needed.  Encouraged increase daily water intake to 64 ounces per day while taking these medications.  Advised if symptoms worsen and/or unresolved please follow-up with your PCP or here for further evaluation.

## 2024-03-10 NOTE — ED Triage Notes (Signed)
 Pt states that she has some left forearm pain. X1 day  Pt denies any known injury. Pt states that it is a stabbing pain that comes and goes.

## 2024-03-14 NOTE — Telephone Encounter (Signed)
Needs appointment please

## 2024-03-14 NOTE — Telephone Encounter (Signed)
LMVM for the patient to contact the office to schedule an appointment.  

## 2024-03-14 NOTE — Telephone Encounter (Signed)
 Spoke with patient and scheduled an appointment 03/15/24 at 11:10am with Christen Butter, NP

## 2024-03-15 ENCOUNTER — Ambulatory Visit

## 2024-03-15 ENCOUNTER — Encounter: Payer: Self-pay | Admitting: Medical-Surgical

## 2024-03-15 ENCOUNTER — Ambulatory Visit (INDEPENDENT_AMBULATORY_CARE_PROVIDER_SITE_OTHER): Admitting: Medical-Surgical

## 2024-03-15 VITALS — BP 150/88 | HR 100 | Resp 20 | Ht 65.0 in | Wt 167.0 lb

## 2024-03-15 DIAGNOSIS — M25512 Pain in left shoulder: Secondary | ICD-10-CM

## 2024-03-15 DIAGNOSIS — R079 Chest pain, unspecified: Secondary | ICD-10-CM | POA: Diagnosis not present

## 2024-03-15 MED ORDER — CYCLOBENZAPRINE HCL 10 MG PO TABS: 5.0000 mg | ORAL_TABLET | Freq: Three times a day (TID) | ORAL | 0 refills | Status: DC | PRN

## 2024-03-15 MED ORDER — METHYLPREDNISOLONE 4 MG PO TBPK: ORAL_TABLET | ORAL | 0 refills | Status: DC

## 2024-03-15 MED ORDER — MELOXICAM 15 MG PO TABS: 15.0000 mg | ORAL_TABLET | Freq: Every day | ORAL | 0 refills | Status: DC

## 2024-03-15 NOTE — Progress Notes (Signed)
        Established patient visit  History, exam, impression, and plan:  1. Left-sided chest pain (Primary) 2. Acute pain of left shoulder Pleasant 58 year old female presenting today with reports of upper back pain including her left shoulder, left collarbone, and wrapping around to the left upper chest.  This has been going on approximately 10 days and waxes and wanes.  Has not been able to find a particular pattern or relation to activity level.  Has had some shortness of breath that accompanies it but this is mild.  No palpitations, diaphoresis, nausea, or vomiting.  Was seen for the symptoms at urgent care but notes that the provider there only came in long enough to tell her that her problems were musculoskeletal and she should take ibuprofen 800 mg every 8 hours as needed.  An exam was not performed and no further workup was done.  She has taken the ibuprofen as directed which helped with the upper back pain however the pain in her left shoulder, collarbone, and left upper chest.  On exam, HRRR, S1/S2 normal.  Respirations even and unlabored.  Tender area immediately to the edge of the scapula in the upper back as well as along the left upper chest extending to the clavicle.  In office EKG showing normal sinus rhythm with rate of 79, normal axis.  Machine read shows possible left atrial enlargement but this is consistent with readings in the past.  There were no acute changes when compared to the last EKG on file.  Getting a chest x-ray today.  Blood pressure elevated on arrival.  With the pain being reproducible, strongly suspect this is costochondritis.  Discontinue ibuprofen.  Start Medrol Dosepak.  Once this is complete start meloxicam 15 mg daily.  Adding Flexeril 3 times daily as needed.  Recommend stretching exercises, massage, heat or ice, Tylenol, or topical analgesics for management.  Plan close follow-up in 4 weeks - EKG 12-Lead - DG Chest 2 View; Future  Procedures performed this  visit: None.  Return in about 4 weeks (around 04/12/2024) for chest/shoulder pain and BP follow up.  __________________________________ Maryl Snook, DNP, APRN, FNP-BC Primary Care and Sports Medicine [facility]

## 2024-03-19 ENCOUNTER — Encounter: Payer: Self-pay | Admitting: Medical-Surgical

## 2024-03-19 DIAGNOSIS — I10 Essential (primary) hypertension: Secondary | ICD-10-CM

## 2024-03-26 ENCOUNTER — Telehealth: Payer: Self-pay

## 2024-03-26 MED ORDER — VALSARTAN-HYDROCHLOROTHIAZIDE 320-25 MG PO TABS: ORAL_TABLET | ORAL | 5 refills | Status: DC

## 2024-03-26 NOTE — Telephone Encounter (Signed)
 Copied from CRM [id number]. Topic: Clinical - Medication Question >> Mar 25, 2024  4:49 PM Brynn Caras wrote: Reason for CRM: The patient is requesting to speak with her PCP or her nurse regarding her valsartan -hydrochlorothiazide  (DIOVAN -HCT) 320-25 MG tablet for an unspecified reason. I advised since the office will close soon, she may not receive a callback until tomorrow. PT understood  Callback [phone]

## 2024-03-26 NOTE — Telephone Encounter (Signed)
 Sheryle Donning, looks like script was resent today with specific brand .

## 2024-04-12 ENCOUNTER — Ambulatory Visit: Admitting: Medical-Surgical

## 2024-05-24 ENCOUNTER — Other Ambulatory Visit: Payer: Self-pay | Admitting: Medical-Surgical

## 2024-06-21 ENCOUNTER — Encounter: Payer: Self-pay | Admitting: Medical-Surgical

## 2024-06-21 ENCOUNTER — Ambulatory Visit (INDEPENDENT_AMBULATORY_CARE_PROVIDER_SITE_OTHER): Admitting: Medical-Surgical

## 2024-06-21 VITALS — BP 132/83 | HR 111 | Resp 20 | Ht 65.0 in | Wt 169.1 lb

## 2024-06-21 DIAGNOSIS — Z1231 Encounter for screening mammogram for malignant neoplasm of breast: Secondary | ICD-10-CM

## 2024-06-21 DIAGNOSIS — Z124 Encounter for screening for malignant neoplasm of cervix: Secondary | ICD-10-CM | POA: Diagnosis not present

## 2024-06-21 DIAGNOSIS — E010 Iodine-deficiency related diffuse (endemic) goiter: Secondary | ICD-10-CM

## 2024-06-21 DIAGNOSIS — L83 Acanthosis nigricans: Secondary | ICD-10-CM

## 2024-06-21 DIAGNOSIS — Z0000 Encounter for general adult medical examination without abnormal findings: Secondary | ICD-10-CM | POA: Diagnosis not present

## 2024-06-21 MED ORDER — AIRSUPRA 90-80 MCG/ACT IN AERO: 2.0000 | INHALATION_SPRAY | Freq: Four times a day (QID) | RESPIRATORY_TRACT | 11 refills | Status: DC | PRN

## 2024-06-21 MED ORDER — AMOXICILLIN-POT CLAVULANATE 875-125 MG PO TABS
1.0000 | ORAL_TABLET | Freq: Two times a day (BID) | ORAL | 0 refills | Status: DC
Start: 2024-06-21 — End: 2024-07-16

## 2024-06-21 NOTE — Patient Instructions (Signed)
 Preventive Care 16-60 Years Old, Female  Preventive care refers to lifestyle choices and visits with your health care provider that can promote health and wellness. Preventive care visits are also called wellness exams.  What can I expect for my preventive care visit?  Counseling  Your health care provider may ask you questions about your:  Medical history, including:  Past medical problems.  Family medical history.  Pregnancy history.  Current health, including:  Menstrual cycle.  Method of birth control.  Emotional well-being.  Home life and relationship well-being.  Sexual activity and sexual health.  Lifestyle, including:  Alcohol, nicotine or tobacco, and drug use.  Access to firearms.  Diet, exercise, and sleep habits.  Work and work Astronomer.  Sunscreen use.  Safety issues such as seatbelt and bike helmet use.  Physical exam  Your health care provider will check your:  Height and weight. These may be used to calculate your BMI (body mass index). BMI is a measurement that tells if you are at a healthy weight.  Waist circumference. This measures the distance around your waistline. This measurement also tells if you are at a healthy weight and may help predict your risk of certain diseases, such as type 2 diabetes and high blood pressure.  Heart rate and blood pressure.  Body temperature.  Skin for abnormal spots.  What immunizations do I need?    Vaccines are usually given at various ages, according to a schedule. Your health care provider will recommend vaccines for you based on your age, medical history, and lifestyle or other factors, such as travel or where you work.  What tests do I need?  Screening  Your health care provider may recommend screening tests for certain conditions. This may include:  Lipid and cholesterol levels.  Diabetes screening. This is done by checking your blood sugar (glucose) after you have not eaten for a while (fasting).  Pelvic exam and Pap test.  Hepatitis B test.  Hepatitis C  test.  HIV (human immunodeficiency virus) test.  STI (sexually transmitted infection) testing, if you are at risk.  Lung cancer screening.  Colorectal cancer screening.  Mammogram. Talk with your health care provider about when you should start having regular mammograms. This may depend on whether you have a family history of breast cancer.  BRCA-related cancer screening. This may be done if you have a family history of breast, ovarian, tubal, or peritoneal cancers.  Bone density scan. This is done to screen for osteoporosis.  Talk with your health care provider about your test results, treatment options, and if necessary, the need for more tests.  Follow these instructions at home:  Eating and drinking    Eat a diet that includes fresh fruits and vegetables, whole grains, lean protein, and low-fat dairy products.  Take vitamin and mineral supplements as recommended by your health care provider.  Do not drink alcohol if:  Your health care provider tells you not to drink.  You are pregnant, may be pregnant, or are planning to become pregnant.  If you drink alcohol:  Limit how much you have to 0-1 drink a day.  Know how much alcohol is in your drink. In the [place] one drink equals one 12 oz bottle of beer (355 mL), one 5 oz glass of wine (148 mL), or one 1 oz glass of hard liquor (44 mL).  Lifestyle  Brush your teeth every morning and night with fluoride toothpaste. Floss one time each day.  Exercise for at least  30 minutes 5 or more days each week.  Do not use any products that contain nicotine or tobacco. These products include cigarettes, chewing tobacco, and vaping devices, such as e-cigarettes. If you need help quitting, ask your health care provider.  Do not use drugs.  If you are sexually active, practice safe sex. Use a condom or other form of protection to prevent STIs.  If you do not wish to become pregnant, use a form of birth control. If you plan to become pregnant, see your health care provider for a  prepregnancy visit.  Take aspirin only as told by your health care provider. Make sure that you understand how much to take and what form to take. Work with your health care provider to find out whether it is safe and beneficial for you to take aspirin daily.  Find healthy ways to manage stress, such as:  Meditation, yoga, or listening to music.  Journaling.  Talking to a trusted person.  Spending time with friends and family.  Minimize exposure to UV radiation to reduce your risk of skin cancer.  Safety  Always wear your seat belt while driving or riding in a vehicle.  Do not drive:  If you have been drinking alcohol. Do not ride with someone who has been drinking.  When you are tired or distracted.  While texting.  If you have been using any mind-altering substances or drugs.  Wear a helmet and other protective equipment during sports activities.  If you have firearms in your house, make sure you follow all gun safety procedures.  Seek help if you have been physically or sexually abused.  What's next?  Visit your health care provider once a year for an annual wellness visit.  Ask your health care provider how often you should have your eyes and teeth checked.  Stay up to date on all vaccines.  This information is not intended to replace advice given to you by your health care provider. Make sure you discuss any questions you have with your health care provider.  Document Revised: 05/19/2021 Document Reviewed: 05/19/2021  Elsevier Patient Education  2024 [employer].

## 2024-06-21 NOTE — Progress Notes (Signed)
 Complete physical exam  Patient: Tammy Graves   DOB: May 11, 1966   58 y.o. Female  MRN: [id number]  Subjective:    Chief Complaint  Patient presents with   Annual Exam    Tammy Graves is a 58 y.o. female who presents today for a complete physical exam. She reports consuming a general diet. Riding a stationary bike and rowing three times weekly. She generally feels well. She reports sleeping poorly. She does not have additional problems to discuss today.    Most recent fall risk assessment:    04/04/2023    3:46 PM  Fall Risk   Falls in the past year? 0  Number falls in past yr: 0  Injury with Fall? 0  Risk for fall due to : No Fall Risks  Follow up Falls evaluation completed     Most recent depression screenings:    02/13/2024   10:14 AM 04/04/2023    3:46 PM  PHQ 2/9 Scores  PHQ - 2 Score 2 2  PHQ- 9 Score 6 5    Vision:Within last year and Dental: No current dental problems and Receives regular dental care    Patient Care Team: Willo Mini, NP as PCP - General (Nurse Practitioner) Glean Stephane BROCKS, RN (Inactive) as Oncology Nurse Navigator Tyree Nanetta SAILOR, RN as Oncology Nurse Navigator Curvin Deward MOULD, MD as Consulting Physician (General Surgery) Magrinat, Sandria BROCKS, MD (Inactive) as Consulting Physician (Oncology) Shannon Agent, MD as Consulting Physician (Radiation Oncology)   Outpatient Medications Prior to Visit  Medication Sig   albuterol  (VENTOLIN  HFA) 108 (90 Base) MCG/ACT inhaler TAKE 2 PUFFS BY MOUTH EVERY 6 HOURS AS NEEDED FOR WHEEZE OR SHORTNESS OF BREATH   aspirin 81 MG chewable tablet Chew by mouth daily.   budesonide -formoterol  (SYMBICORT ) 80-4.5 MCG/ACT inhaler Inhale 2 puffs into the lungs 2 (two) times daily.   cyclobenzaprine  (FLEXERIL ) 10 MG tablet Take 0.5-1 tablets (5-10 mg total) by mouth 3 (three) times daily as needed.   ibuprofen  (ADVIL ) 800 MG tablet Take 1 tablet (800 mg total) by mouth daily as needed.   meloxicam  (MOBIC ) 15 MG  tablet Take 1 tablet (15 mg total) by mouth daily.   Multiple Vitamin (MULTIVITAMIN WITH MINERALS) TABS tablet Take 1 tablet by mouth daily.   pantoprazole  (PROTONIX ) 20 MG tablet TAKE 1 TABLET BY MOUTH EVERY DAY   pravastatin  (PRAVACHOL ) 20 MG tablet Take 1 tablet (20 mg total) by mouth daily.   valsartan -hydrochlorothiazide  (DIOVAN -HCT) 320-25 MG tablet TAKE 1 TABLET BY MOUTH DAILY. **MACLEOD BRAND ONLY**   [DISCONTINUED] methylPREDNISolone  (MEDROL  DOSEPAK) 4 MG TBPK tablet Take as directed   No facility-administered medications prior to visit.   Review of Systems  Constitutional:  Negative for chills, fever, malaise/fatigue and weight loss.  HENT:  Positive for congestion. Negative for ear pain, hearing loss, sinus pain and sore throat.   Eyes:  Negative for blurred vision, photophobia and pain.  Respiratory:  Positive for cough and shortness of breath. Negative for wheezing.   Cardiovascular:  Negative for chest pain, palpitations and leg swelling.  Gastrointestinal:  Negative for abdominal pain, constipation, diarrhea, heartburn, nausea and vomiting.  Genitourinary:  Negative for dysuria, frequency and urgency.  Musculoskeletal:  Negative for falls and neck pain.  Skin:  Negative for itching and rash.  Neurological:  Positive for headaches. Negative for dizziness and weakness.  Endo/Heme/Allergies:  Negative for polydipsia. Does not bruise/bleed easily.  Psychiatric/Behavioral:  Positive for depression. Negative for substance abuse and suicidal ideas. The  patient is nervous/anxious and has insomnia.      Objective:    BP 132/83 (BP Location: Left Arm, Cuff Size: Normal)   Pulse (!) 111   Resp 20   Ht 5' 5 (1.651 m)   Wt 169 lb 1.9 oz (76.7 kg)   LMP 08/05/2020 (Approximate) Comment: thinks she going thru menopause  SpO2 98%   BMI 28.14 kg/m    Physical Exam Constitutional:      General: She is not in acute distress.    Appearance: Normal appearance. She is not  ill-appearing.  HENT:     Head: Normocephalic and atraumatic.     Right Ear: Tympanic membrane, ear canal and external ear normal. There is no impacted cerumen.     Left Ear: Tympanic membrane, ear canal and external ear normal. There is no impacted cerumen.     Nose: Nose normal. No congestion or rhinorrhea.     Mouth/Throat:     Mouth: Mucous membranes are moist.     Pharynx: No oropharyngeal exudate or posterior oropharyngeal erythema.  Eyes:     General: No scleral icterus.       Right eye: No discharge.        Left eye: No discharge.     Extraocular Movements: Extraocular movements intact.     Conjunctiva/sclera: Conjunctivae normal.     Pupils: Pupils are equal, round, and reactive to light.  Neck:     Thyroid: No thyromegaly.     Vascular: No carotid bruit or JVD.     Trachea: Trachea normal.  Cardiovascular:     Rate and Rhythm: Normal rate and regular rhythm.     Pulses: Normal pulses.     Heart sounds: Normal heart sounds. No murmur heard.    No friction rub. No gallop.  Pulmonary:     Effort: Pulmonary effort is normal. No respiratory distress.     Breath sounds: Normal breath sounds. No wheezing.  Abdominal:     General: Bowel sounds are normal. There is no distension.     Palpations: Abdomen is soft.     Tenderness: There is no abdominal tenderness. There is no guarding.  Musculoskeletal:        General: Normal range of motion.     Cervical back: Normal range of motion and neck supple.  Lymphadenopathy:     Cervical: No cervical adenopathy.  Skin:    General: Skin is warm and dry.  Neurological:     Mental Status: She is alert and oriented to person, place, and time.     Cranial Nerves: No cranial nerve deficit.  Psychiatric:        Mood and Affect: Mood normal.        Behavior: Behavior normal.        Thought Content: Thought content normal.        Judgment: Judgment normal.      No results found for any visits on 06/21/24.     Assessment & Plan:     Routine Health Maintenance and Physical Exam  Immunization History  Administered Date(s) Administered   Influenza, Seasonal, Injecte, Preservative Fre 09/13/2016   Influenza-Unspecified 09/05/2019, 11/14/2020   MMR 01/23/2014   Moderna Sars-Covid-2 Vaccination 01/06/2020, 01/31/2020, 12/01/2020   PPD Test 08/27/2021   Tetanus 08/05/2021   Zoster Recombinant(Shingrix) 08/15/2021, 02/02/2022    Health Maintenance  Topic Date Due   COVID-19 Vaccine (4 - 2024-25 season) 07/07/2024 (Originally 08/06/2023)   DTaP/Tdap/Td (1 - Tdap) 02/12/2025 (Originally 08/06/2021)  Pneumococcal Vaccine 75-40 Years old (1 of 2 - PCV) 02/12/2025 (Originally 04/16/1983)   MAMMOGRAM  02/12/2025 (Originally 09/10/2022)   Colonoscopy  02/12/2025 (Originally 04/15/2009)   Hepatitis C Screening  02/12/2025 (Originally 04/15/1982)   HIV Screening  02/12/2025 (Originally 04/16/1979)   Cervical Cancer Screening (HPV/Pap Cotest)  06/21/2025 (Originally 04/15/1994)   INFLUENZA VACCINE  07/05/2024   Zoster Vaccines- Shingrix  Completed   Hepatitis B Vaccines  Aged Out   HPV VACCINES  Aged Out   Meningococcal B Vaccine  Aged Out    Discussed health benefits of physical activity, and encouraged her to engage in regular exercise appropriate for her age and condition.  1. Annual physical exam (Primary) Checking labs as below. UTD on preventative care. Wellness information provided with AVS. return at your convenience for a nurse visit for a TB skin test and the subsequent read. - CBC with Differential/Platelet - CMP14+EGFR - Lipid panel  2. Cervical cancer screening Declined cervical cancer screening.  Declined referral to OB/GYN.  Sites a very unpleasant history during her battle with breast cancer.  3. Encounter for screening mammogram for malignant neoplasm of breast Declined mammogram.  Again cites unpleasant history with breast cancer and does not desire to continue with breast cancer screening.   Return in  about 6 months (around 12/22/2024) for HTN follow up.   Makinzee Durley, NP

## 2024-06-22 LAB — CBC WITH DIFFERENTIAL/PLATELET
Basophils Absolute: 0 x10E3/uL (ref 0.0–0.2)
Basos: 1 %
EOS (ABSOLUTE): 0.3 x10E3/uL (ref 0.0–0.4)
Eos: 7 %
Hematocrit: 37.4 % (ref 34.0–46.6)
Hemoglobin: 12.7 g/dL (ref 11.1–15.9)
Immature Grans (Abs): 0 x10E3/uL (ref 0.0–0.1)
Immature Granulocytes: 0 %
Lymphocytes Absolute: 1.5 x10E3/uL (ref 0.7–3.1)
Lymphs: 36 %
MCH: 31.3 pg (ref 26.6–33.0)
MCHC: 34 g/dL (ref 31.5–35.7)
MCV: 92 fL (ref 79–97)
Monocytes Absolute: 0.4 x10E3/uL (ref 0.1–0.9)
Monocytes: 9 %
Neutrophils Absolute: 2 x10E3/uL (ref 1.4–7.0)
Neutrophils: 47 %
Platelets: 163 x10E3/uL (ref 150–450)
RBC: 4.06 x10E6/uL (ref 3.77–5.28)
RDW: 12.3 % (ref 11.7–15.4)
WBC: 4.2 x10E3/uL (ref 3.4–10.8)

## 2024-06-22 LAB — CMP14+EGFR
ALT: 47 IU/L — ABNORMAL HIGH (ref 0–32)
AST: 45 IU/L — ABNORMAL HIGH (ref 0–40)
Albumin: 4.6 g/dL (ref 3.8–4.9)
Alkaline Phosphatase: 114 IU/L (ref 44–121)
BUN/Creatinine Ratio: 20 (ref 12–28)
BUN: 15 mg/dL (ref 8–27)
Bilirubin Total: 0.5 mg/dL (ref 0.0–1.2)
CO2: 22 mmol/L (ref 20–29)
Calcium: 9.8 mg/dL (ref 8.7–10.3)
Chloride: 97 mmol/L (ref 96–106)
Creatinine, Ser: 0.74 mg/dL (ref 0.57–1.00)
Globulin, Total: 2.7 g/dL (ref 1.5–4.5)
Glucose: 98 mg/dL (ref 70–99)
Potassium: 3.3 mmol/L — ABNORMAL LOW (ref 3.5–5.2)
Sodium: 135 mmol/L (ref 134–144)
Total Protein: 7.3 g/dL (ref 6.0–8.5)
eGFR: 93 mL/min/1.73 (ref >59)

## 2024-06-22 LAB — LIPID PANEL
Chol/HDL Ratio: 4.2 ratio (ref 0.0–4.4)
Cholesterol, Total: 357 mg/dL — ABNORMAL HIGH (ref 100–199)
HDL: 86 mg/dL (ref >39)
LDL Chol Calc (NIH): 265 mg/dL — ABNORMAL HIGH (ref 0–99)
Triglycerides: 55 mg/dL (ref 0–149)
VLDL Cholesterol Cal: 6 mg/dL (ref 5–40)

## 2024-06-22 LAB — HEMOGLOBIN A1C
Est. average glucose Bld gHb Est-mCnc: 111 mg/dL
Hgb A1c MFr Bld: 5.5 % (ref 4.8–5.6)

## 2024-06-22 LAB — TSH: TSH: 0.569 u[IU]/mL (ref 0.450–4.500)

## 2024-06-23 ENCOUNTER — Ambulatory Visit: Payer: Self-pay | Admitting: Medical-Surgical

## 2024-06-25 ENCOUNTER — Other Ambulatory Visit: Payer: Self-pay | Admitting: Medical-Surgical

## 2024-06-25 MED ORDER — PRAVASTATIN SODIUM 40 MG PO TABS: 40.0000 mg | ORAL_TABLET | Freq: Every day | ORAL | 3 refills | Status: DC

## 2024-07-01 ENCOUNTER — Ambulatory Visit (INDEPENDENT_AMBULATORY_CARE_PROVIDER_SITE_OTHER)

## 2024-07-01 DIAGNOSIS — Z111 Encounter for screening for respiratory tuberculosis: Secondary | ICD-10-CM | POA: Diagnosis not present

## 2024-07-01 NOTE — Progress Notes (Signed)
   Established Patient Office Visit  Subjective   Patient ID: Tammy Graves, female    DOB: Jul 15, 1966  Age: 58 y.o. MRN: [id number]  Chief Complaint  Patient presents with   PPD Placement    HPI  Carliss Porcaro is here for PPD placement. Denies positive PPD in the past.   ROS    Objective:     LMP 08/05/2020 (Approximate) Comment: thinks she going thru menopause   Physical Exam   No results found for any visits on 07/01/24.    The ASCVD Risk score (Arnett DK, et al., 2019) failed to calculate for the following reasons:   The valid total cholesterol range is 130 to 320 mg/dL    Assessment & Plan:  PPD placement - Patient tolerated injection well without complications. Patient advised to schedule PPD reading in 2-3 days from today.   Problem List Items Addressed This Visit   None Visit Diagnoses       Screening-pulmonary TB    -  Primary   Relevant Orders   PPD (Completed)       Return in about 3 days (around 07/04/2024) for PPD reading. SABRA Pear, Jon Mayor, CMA

## 2024-07-03 ENCOUNTER — Ambulatory Visit (INDEPENDENT_AMBULATORY_CARE_PROVIDER_SITE_OTHER)

## 2024-07-03 VITALS — BP 135/85 | HR 103 | Ht 65.0 in

## 2024-07-03 DIAGNOSIS — Z111 Encounter for screening for respiratory tuberculosis: Secondary | ICD-10-CM

## 2024-07-03 LAB — TB SKIN TEST
Induration: 0 mm
TB Skin Test: NEGATIVE

## 2024-07-03 NOTE — Patient Instructions (Signed)
 Return as needed

## 2024-07-03 NOTE — Progress Notes (Signed)
   Established Patient Office Visit  Subjective   Patient ID: Tammy Graves, female    DOB: [birth year]  Age: 58 y.o. MRN: [id number]  Chief Complaint  Patient presents with   PPD Reading    PPD was placed on 07/01/2024 @ 8:45am- patient in office for PPD read today .    HPI  PPD read only nurse visit.   ROS    Objective:     BP 135/85   Pulse (!) 103   Ht 5' 5 (1.651 m)   LMP 08/05/2020 (Approximate) Comment: thinks she going thru menopause  SpO2 98%   BMI 28.14 kg/m    Physical Exam   No results found for any visits on 07/03/24.    The ASCVD Risk score (Arnett DK, et al., 2019) failed to calculate for the following reasons:   The valid total cholesterol range is 130 to 320 mg/dL    Assessment & Plan:  PPD results - negative - 0mm induration- no redness.  Problem List Items Addressed This Visit   None   No follow-ups on file.    Tammy SHAUNNA Plenty, LPN

## 2024-07-16 MED ORDER — CEFDINIR 300 MG PO CAPS: 300.0000 mg | ORAL_CAPSULE | Freq: Two times a day (BID) | ORAL | 0 refills | Status: DC

## 2024-07-16 NOTE — Addendum Note (Signed)
 Addended byBETHA WILLO MINI on: 07/16/2024 05:20 PM   Modules accepted: Orders

## 2024-07-26 ENCOUNTER — Encounter: Payer: Self-pay | Admitting: Medical-Surgical

## 2024-07-26 ENCOUNTER — Ambulatory Visit: Admitting: Medical-Surgical

## 2024-07-26 DIAGNOSIS — B9689 Other specified bacterial agents as the cause of diseases classified elsewhere: Secondary | ICD-10-CM

## 2024-07-26 DIAGNOSIS — J208 Acute bronchitis due to other specified organisms: Secondary | ICD-10-CM

## 2024-07-26 MED ORDER — PREDNISONE 50 MG PO TABS: 50.0000 mg | ORAL_TABLET | Freq: Every day | ORAL | 0 refills | Status: DC

## 2024-07-26 MED ORDER — DOXYCYCLINE HYCLATE 100 MG PO TABS: 100.0000 mg | ORAL_TABLET | Freq: Two times a day (BID) | ORAL | 0 refills | Status: AC

## 2024-07-26 NOTE — Progress Notes (Signed)
        Established patient visit   History of Present Illness   Discussed the use of AI scribe software for clinical note transcription with the patient, who gave verbal consent to proceed.  History of Present Illness   Tammy Graves is a 58 year old female with asthma who presents with persistent cough and wheezing.  Respiratory symptoms - Persistent cough and wheezing since the end of last month - Wheezing described as a 'whistling sound' in the chest - Cough accompanied by mucus production, with recent darkening of mucus color - Shortness of breath occurs only during coughing episodes - No chest pain, fever, or headaches - Asthma managed with albuterol  inhalers, which provide relief - Previous treatment with amoxicillin  was ineffective and caused gastrointestinal distress - Stopped Augmentin , switched to Omnicef  which was completed - Mucinex liquid did not significantly improve symptoms - Doxycycline  has been effective in the past      Physical Exam   Physical Exam Vitals reviewed.  Constitutional:      General: She is not in acute distress.    Appearance: Normal appearance.  HENT:     Head: Normocephalic and atraumatic.  Cardiovascular:     Rate and Rhythm: Normal rate and regular rhythm.     Pulses: Normal pulses.     Heart sounds: Normal heart sounds. No murmur heard.    No friction rub. No gallop.  Pulmonary:     Effort: Pulmonary effort is normal. No respiratory distress.     Breath sounds: Normal breath sounds. No wheezing.  Skin:    General: Skin is warm and dry.  Neurological:     Mental Status: She is alert and oriented to person, place, and time.  Psychiatric:        Mood and Affect: Mood normal.        Behavior: Behavior normal.        Thought Content: Thought content normal.        Judgment: Judgment normal.    Assessment & Plan   Assessment and Plan    Acute bacterial bronchitis with persistent cough and wheezing, exacerbated by  asthma Persistent cough and wheezing with ineffective previous antibiotic treatments. Doxycycline  effective in past. Risk of C. diff due to multiple antibiotics. Poor lung sounds noted. - Prescribed doxycycline . - Prescribed prednisone  50 mg daily for 5 days. - Advised to monitor for diarrhea and signs of C. diff infection. - Feel x-ray is warranted but patient declined due to no x-ray available in our location this afternoon. - Instructed to send a message for x-ray referral if no improvement by Monday. - Ensured albuterol  inhaler availability and use as needed.     Follow up   Return if symptoms worsen or fail to improve. __________________________________ Zada FREDRIK Palin, DNP, APRN, FNP-BC Primary Care and Sports Medicine [facility] [place]

## 2024-09-29 ENCOUNTER — Other Ambulatory Visit: Payer: Self-pay | Admitting: Medical-Surgical

## 2024-09-29 DIAGNOSIS — I10 Essential (primary) hypertension: Secondary | ICD-10-CM

## 2024-10-17 ENCOUNTER — Telehealth: Payer: Self-pay

## 2024-10-17 NOTE — Telephone Encounter (Signed)
 I tried to call Tammy Graves. No answer. No voicemail. She is due for mammogram, pap and colon cancer screening.

## 2024-11-04 ENCOUNTER — Ambulatory Visit
Admission: EM | Admit: 2024-11-04 | Discharge: 2024-11-04 | Disposition: A | Discharge status: Home / Self Care | Attending: Family Medicine | Admitting: Family Medicine

## 2024-11-04 ENCOUNTER — Other Ambulatory Visit: Payer: Self-pay

## 2024-11-04 DIAGNOSIS — I213 ST elevation (STEMI) myocardial infarction of unspecified site: Secondary | ICD-10-CM

## 2024-11-04 DIAGNOSIS — R079 Chest pain, unspecified: Secondary | ICD-10-CM

## 2024-11-04 NOTE — ED Notes (Signed)
 Patient is being discharged from the Urgent Care and sent to the Emergency Department via POV . Per Dr Maranda, patient is in need of higher level of care due to further evaluation of acute chest pain. Patient declined EMS transport.  Her son will bring her to the ED.  Patient is aware and verbalizes understanding of plan of care.  Vitals:   11/04/24 1834  BP: (!) 162/107  Pulse: (!) 109  Resp: 20  Temp: 98 F (36.7 C)  SpO2: 99%

## 2024-11-04 NOTE — Discharge Instructions (Signed)
Go directly to ER  

## 2024-11-04 NOTE — ED Provider Notes (Signed)
 Tammy Graves    CSN: [id number] Arrival date & time: 11/04/24  1812      History   Chief Complaint No chief complaint on file.   HPI Tammy Graves is a 58 y.o. female.   Tammy Graves states she was eating today.  She had the acute onset of sharp chest pain.  It is making her feel short of breath.  It is making her anxious and very uncomfortable.  She has never had this before.  She has hyperlipidemia and takes pravastatin .  She has hypertension.  No diabetes.  Has never been told she has heart disease.  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      Home Medications    Prior to Admission medications   Medication Sig Start Date End Date Taking? Authorizing Provider  albuterol  (VENTOLIN  HFA) 108 (90 Base) MCG/ACT inhaler TAKE 2 PUFFS BY MOUTH EVERY 6 HOURS AS NEEDED FOR WHEEZE OR SHORTNESS OF BREATH 06/26/24   Willo Mini, NP  Albuterol -Budesonide  (AIRSUPRA ) 90-80 MCG/ACT AERO Inhale 2 puffs into the lungs every 6 (six) hours as needed. 06/21/24   Willo Mini, NP  aspirin 81 MG chewable tablet Chew by  mouth daily.    [provider]  meloxicam  (MOBIC ) 15 MG tablet Take 1 tablet (15 mg total) by mouth daily. 03/15/24   Willo Mini, NP  Multiple Vitamin (MULTIVITAMIN WITH MINERALS) TABS tablet Take 1 tablet by mouth daily.    [provider]  pantoprazole  (PROTONIX ) 20 MG tablet TAKE 1 TABLET BY MOUTH EVERY DAY 05/02/23   Willo Mini, NP  pravastatin  (PRAVACHOL ) 40 MG tablet Take 1 tablet (40 mg total) by mouth daily. 06/25/24   Willo Mini, NP  valsartan -hydrochlorothiazide  (DIOVAN -HCT) 320-25 MG tablet TAKE 1 TABLET BY MOUTH DAILY. **MACLEOD BRAND ONLY** 10/02/24   Willo Mini, NP    Family History Family History  Problem Relation Age of Onset   Hypertension Father    Ovarian cancer Maternal Grandmother 60   Ovarian cancer Cousin     Social History Social History   Tobacco Use   Smoking status: Never   Smokeless tobacco: Never  Vaping Use   Vaping status: Never Used  Substance Use Topics   Alcohol use: Not Currently   Drug use: Not Currently     Allergies   Codeine and Other   Review of Systems Review of Systems See HPI  Physical Exam Triage Vital Signs ED Triage Vitals  Encounter Vitals Group     BP 11/04/24 1834 (!) 162/107  Girls Systolic BP Percentile --      Girls Diastolic BP Percentile --      Boys Systolic BP Percentile --      Boys Diastolic BP Percentile --      Pulse Rate 11/04/24 1834 (!) 109     Resp 11/04/24 1834 20     Temp 11/04/24 1834 98 F (36.7 C)     Temp Source 11/04/24 1834 Oral     SpO2 11/04/24 1834 99 %     Weight 11/04/24 1843 160 lb (72.6 kg)     Height 11/04/24 1843 5' 5 (1.651 m)     Head Circumference --      Peak Flow --      Pain Score 11/04/24 1836 0     Pain Loc --      Pain Education --      Exclude from Growth Chart --    No data found.  Updated Vital Signs BP (!) 162/107 (BP Location: Right Arm)   Pulse (!) 109   Temp 98 F (36.7 C) (Oral)   Resp 20   Ht 5' 5 (1.651 m)   Wt 72.6 kg    LMP 08/05/2020 (Approximate) Comment: thinks she going thru menopause  SpO2 99%   BMI 26.63 kg/m        Physical Exam Constitutional:      General: She is in acute distress.     Appearance: She is well-developed and normal weight.  HENT:     Head: Normocephalic and atraumatic.  Eyes:     Conjunctiva/sclera: Conjunctivae normal.     Pupils: Pupils are equal, round, and reactive to light.  Cardiovascular:     Rate and Rhythm: Normal rate and regular rhythm.     Heart sounds: Normal heart sounds.  Pulmonary:     Effort: Pulmonary effort is normal. No respiratory distress.     Breath sounds: Normal breath sounds.  Abdominal:     General: There is no distension.     Palpations: Abdomen is soft.  Musculoskeletal:        General: Normal range of motion.     Cervical back: Normal range of motion.     Right lower leg: No edema.     Left lower leg: No edema.  Skin:    General: Skin is warm and dry.  Neurological:     Mental Status: She is alert.      UC Treatments / Results  Labs (all labs ordered are listed, but only abnormal results are displayed) Labs Reviewed - No data to display  EKG   Radiology No results found.  Procedures Procedures (including critical Graves time)  Medications Ordered in UC Medications - No data to display  Initial Impression / Assessment and Plan / UC Course  I have reviewed the triage vital signs and the nursing notes.  Pertinent labs & imaging results that were available during my Graves of the patient were reviewed by me and considered in my medical decision making (see chart for details).     Patient has an EKG with no acute ST changes.  Appears she has an ST elevated MI in the anterior leads.  I discussed this with the patient.  Her son came in.  He called EMS.  She is already had an aspirin.  I ordered oxygen and an IV.  Patient immediately refused.  She absolutely refused to get into an ambulance.  Even after discussion with the  paramedics she would not  get into the ambulance.  She was advised in no uncertain terms that it was dangerous to try to drive by vehicle into [place] while having heart symptoms.  I explained that it could result in sudden death.  She was not dissuaded and left with her son AGAINST MEDICAL ADVICE.  Son was also advised I did not want him to drive her, but wanted him to console her so she would get in the ambulance.  He states he was unable to do that. Final Clinical Impressions(s) / UC Diagnoses   Final diagnoses:  Acute chest pain  Acute ST elevation myocardial infarction (STEMI), unspecified artery [facility])     Discharge Instructions      Go directly to ER    ED Prescriptions   None    PDMP not reviewed this encounter.   Maranda Jamee Jacob, MD 11/04/24 TYRA

## 2024-11-04 NOTE — ED Triage Notes (Signed)
 Pt presenting with c/o chest pain after consuming her meal at 5:00pm. Pt stated she feels sharp pain which is non radiating. Pt stated she took 1/2 Aspirin 1 hour ago,

## 2024-11-18 ENCOUNTER — Telehealth: Payer: Self-pay | Admitting: Medical-Surgical

## 2024-11-18 ENCOUNTER — Ambulatory Visit: Payer: Self-pay

## 2024-11-18 NOTE — Telephone Encounter (Signed)
°  °  Topic Clinical - Red Word Triage  Communication Red Word that prompted transfer to Nurse Triage: Tammy Graves patients son just picked her up from work. Patient is light headed and heart racing, chest pain.

## 2024-11-18 NOTE — Telephone Encounter (Unsigned)
 Copied from CRM #[id number]. Topic: Clinical - Red Word Triage >> Nov 18, 2024  9:25 AM Rosaria A wrote: Red Word that prompted transfer to Nurse Triage: Darrin patients son just picked her up from work. Patient is light headed and heart racing, chest pain.

## 2024-11-18 NOTE — Telephone Encounter (Unsigned)
 Copied from CRM #[id number]. Topic: Clinical - Red Word Triage >> Nov 18, 2024  9:25 AM Rosaria A wrote: Red Word that prompted transfer to Nurse Triage: Tammy Graves patients son just picked her up from work. Patient is light headed and heart racing, chest pain.

## 2024-11-18 NOTE — Telephone Encounter (Signed)
 FYI Only or Action Required?: FYI only for provider: ED advised.  Patient was last seen in primary care on 07/26/2024 by Willo Mini, NP.  Called Nurse Triage reporting No chief complaint on file..  Symptoms began today.  Interventions attempted: Rest, hydration, or home remedies.  Symptoms are: a little better but still feeling unwell.  Triage Disposition: Go to ED Now (or PCP Triage)  Patient/caregiver understands and will follow disposition?: Yes           Topic Clinical - Red Word Triage   Communication Red Word that prompted transfer to Nurse Triage: Darrin patients son just picked her up from work. Patient is light headed and heart racing, chest pain. Reason for Disposition  Patient sounds very sick or weak to the triager  Answer Assessment - Initial Assessment Questions Chest Pain this morning but patient then went to work Heart rate was high---130 this morning per son Son received a call from patient's work that the patient was feeling dizzy, felt like her heart was racing Patient states she feels better now than she did earlier  Patient states she went to the ER last week for similar, her potassium was low at that time and she states she had to be given IV potassium and was then referred to a Cardiologist --Son tried to contact the Cardiologist that the patient was referred to last week when she went to the ER for chest pain--they have been unable to get in touch with a Cardiologist so they called the PCP  Patient states she could feel palpitations earlier this morning but she went to work Patient states her blood pressure was 133/90 but her heart rate was in the 130s she states  Patient denies pain at this time but states she is still feeling jittery and anxious about the situation Patient and her son are advised that the Emergency Room is recommended at this time with the symptoms she was experiencing  They are agreeable with this plan and are advised  if anything worsens they can call 911 at any point.  Patient is advised to call us  back if anything changes or with any further questions/concerns. Patient and her son verbalized understanding.  Protocols used: Chest Pain-A-AH

## 2024-11-19 NOTE — Telephone Encounter (Signed)
 See Nurse Triage encounter 11/18/24.

## 2024-11-20 ENCOUNTER — Encounter: Payer: Self-pay | Admitting: Medical-Surgical

## 2024-11-20 MED ORDER — POTASSIUM CHLORIDE CRYS ER 10 MEQ PO TBCR: 10.0000 meq | EXTENDED_RELEASE_TABLET | Freq: Two times a day (BID) | ORAL | 0 refills | Status: DC

## 2024-11-25 ENCOUNTER — Ambulatory Visit: Admitting: Medical-Surgical

## 2024-11-25 ENCOUNTER — Encounter: Payer: Self-pay | Admitting: Medical-Surgical

## 2024-11-25 ENCOUNTER — Telehealth

## 2024-11-25 VITALS — BP 144/84 | HR 89 | Temp 98.5°F | Resp 16 | Ht 65.0 in | Wt 166.1 lb

## 2024-11-25 DIAGNOSIS — E876 Hypokalemia: Secondary | ICD-10-CM

## 2024-11-25 DIAGNOSIS — R051 Acute cough: Secondary | ICD-10-CM

## 2024-11-25 DIAGNOSIS — I10 Essential (primary) hypertension: Secondary | ICD-10-CM | POA: Diagnosis not present

## 2024-11-25 DIAGNOSIS — Z09 Encounter for follow-up examination after completed treatment for conditions other than malignant neoplasm: Secondary | ICD-10-CM

## 2024-11-25 MED ORDER — PROMETHAZINE-DM 6.25-15 MG/5ML PO SYRP: 5.0000 mL | ORAL_SOLUTION | Freq: Four times a day (QID) | ORAL | 0 refills | Status: DC | PRN

## 2024-11-25 NOTE — Progress Notes (Signed)
 Essential hypertension Currently taking valsartan -HCTZ 320-25 mg once daily, compliant with dosing.  Blood pressure is elevated on arrival today and remains elevated on recheck although slightly better.  In the setting of current illness and use of over-the-counter medications, suspect this is transient.  Continue valsartan -HCTZ daily as prescribed.  Discuss further with cardiology on follow-up.  Monitor blood pressure at home with a goal of less than 130/80.  Medical screening examination/treatment was performed by qualified clinical staff member and as supervising provider I was immediately available for consultation/collaboration. I have reviewed documentation and agree with assessment and plan.  Zada FREDRIK Palin, DNP, APRN, FNP-BC [facility] and Sports Medicine

## 2024-11-25 NOTE — Progress Notes (Signed)
" ° °  Established Patient Office Visit  Subjective   Patient ID: Tammy Graves, female    DOB: 07-06-1966  Age: 58 y.o. MRN: [id number]  Chief Complaint  Patient presents with   Medical Management of Chronic Issues   Cough   Nasal Congestion    Cough    58 year female presents for hospital follow up for chest pain  Patient was discharged on a potassium supplement due to potassium levels being 3.1. Patient was seen by Cardiology on 12/18. Potassium levels were rechecked with level being 3.9. She states that all chest pain symptoms has resolved. Follow up with cardiology on 12/24 for stress test and plan to complete a home sleep study.  Patient also presents today productive cough w/ light yellow mucus. Sx started last Thursday. Has tried otc robitussin with no relief. Denies chest pain, shortness of breath, sore throat, runny nose, nasal congestion, fever, or chills.  Review of Systems  Constitutional: Negative.   HENT: Negative.    Eyes: Negative.   Respiratory:  Positive for cough and sputum production.   Cardiovascular: Negative.   Gastrointestinal: Negative.   Genitourinary: Negative.   Musculoskeletal: Negative.   Skin: Negative.   Neurological: Negative.   Endo/Heme/Allergies: Negative.   Psychiatric/Behavioral: Negative.        Objective:     BP (!) 144/84   Pulse 89   Temp 98.5 F (36.9 C) (Oral)   Resp 16   Ht 5' 5 (1.651 m)   Wt 75.3 kg   LMP 08/05/2020 Comment: thinks she going thru menopause  SpO2 99%   BMI 27.64 kg/m  BP Readings from Last 3 Encounters:  11/25/24 (!) 144/84  11/04/24 (!) 162/107  07/03/24 135/85      Physical Exam Vitals and nursing note reviewed.  Constitutional:      General: She is not in acute distress.    Appearance: Normal appearance.  Cardiovascular:     Rate and Rhythm: Normal rate and regular rhythm.     Pulses: Normal pulses.     Heart sounds: Normal heart sounds.  Pulmonary:     Effort: Pulmonary effort is  normal.     Breath sounds: Normal breath sounds.  Neurological:     General: No focal deficit present.     Mental Status: She is alert and oriented to person, place, and time.  Psychiatric:        Mood and Affect: Mood normal.        Behavior: Behavior normal.        Thought Content: Thought content normal.        Judgment: Judgment normal.     No results found for any visits on 11/25/24.    The ASCVD Risk score (Arnett DK, et al., 2019) failed to calculate for the following reasons:   Risk score cannot be calculated because patient has a medical history suggesting prior/existing ASCVD   * - Cholesterol units were assumed    Assessment & Plan:  1. Hospital discharge follow-up -Symptoms of chest pain has resolved -Patient has follow up with cardiology on 12/24  2. Hypokalemia (Primary) -Hypokalemia resolved -Followed by Cardiology -Follow up on 12/24  3. Acute cough -Rx for promethazine -DM cough syrup -Recommended conservative treatments such as vicks vapo rub, warm tea w/ honey and lemon, and hydration   Return if symptoms worsen or fail to improve.    Derrek JINNY Freund, NP Student  "

## 2024-11-26 ENCOUNTER — Other Ambulatory Visit: Payer: Self-pay | Admitting: Medical-Surgical

## 2024-11-29 ENCOUNTER — Other Ambulatory Visit: Payer: Self-pay | Admitting: Medical-Surgical

## 2024-12-09 ENCOUNTER — Telehealth: Payer: Self-pay

## 2024-12-09 NOTE — Transitions of Care (Post Inpatient/ED Visit) (Unsigned)
" ° °  12/09/2024  Name: Tammy Graves MRN: [id number] DOB: Nov 30, 1966  Today's TOC FU Call Status: Today's TOC FU Call Status:: Unsuccessful Call (1st Attempt) Unsuccessful Call (1st Attempt) Date: 12/09/24  Attempted to reach the patient regarding the most recent Inpatient/ED visit.  Follow Up Plan: Additional outreach attempts will be made to reach the patient to complete the Transitions of Care (Post Inpatient/ED visit) call.   Signature  Charmaine Bloodgood, LPN [facility] Health Advisor [place] [facility] You Are. We Are. [facility] Direct Dial [phone]  "

## 2024-12-11 NOTE — Transitions of Care (Post Inpatient/ED Visit) (Unsigned)
" ° °  12/11/2024  Name: Tammy Graves MRN: [id number] DOB: 03/31/1966  Today's TOC FU Call Status: Today's TOC FU Call Status:: Unsuccessful Call (2nd Attempt) Unsuccessful Call (1st Attempt) Date: 12/09/24 Unsuccessful Call (2nd Attempt) Date: 12/11/24  Attempted to reach the patient regarding the most recent Inpatient/ED visit.  Follow Up Plan: Additional outreach attempts will be made to reach the patient to complete the Transitions of Care (Post Inpatient/ED visit) call.   Signature Nikki M,CMA "

## 2024-12-12 NOTE — Transitions of Care (Post Inpatient/ED Visit) (Unsigned)
" ° °  12/12/2024  Name: Tammy Graves MRN: [id number] DOB: [birth year]  Today's TOC FU Call Status: Today's TOC FU Call Status:: Unsuccessful Call (3rd Attempt) Unsuccessful Call (1st Attempt) Date: 12/09/24 Unsuccessful Call (2nd Attempt) Date: 12/11/24 Unsuccessful Call (3rd Attempt) Date: 12/12/24  Attempted to reach the patient regarding the most recent Inpatient/ED visit.  Follow Up Plan: No further outreach attempts will be made at this time. We have been unable to contact the patient.  Signature Nikki M,CMA "

## 2024-12-19 ENCOUNTER — Ambulatory Visit: Admitting: Medical-Surgical

## 2024-12-19 ENCOUNTER — Encounter: Payer: Self-pay | Admitting: Medical-Surgical

## 2024-12-19 VITALS — BP 162/98 | HR 87 | Temp 98.7°F | Resp 20 | Ht 65.0 in | Wt 165.1 lb

## 2024-12-19 DIAGNOSIS — I10 Essential (primary) hypertension: Secondary | ICD-10-CM | POA: Diagnosis not present

## 2024-12-19 DIAGNOSIS — E871 Hypo-osmolality and hyponatremia: Secondary | ICD-10-CM

## 2024-12-19 DIAGNOSIS — E876 Hypokalemia: Secondary | ICD-10-CM | POA: Diagnosis not present

## 2024-12-19 DIAGNOSIS — Z09 Encounter for follow-up examination after completed treatment for conditions other than malignant neoplasm: Secondary | ICD-10-CM | POA: Diagnosis not present

## 2024-12-19 DIAGNOSIS — Z23 Encounter for immunization: Secondary | ICD-10-CM | POA: Diagnosis not present

## 2024-12-19 MED ORDER — AMLODIPINE BESYLATE 5 MG PO TABS: 5.0000 mg | ORAL_TABLET | Freq: Every day | ORAL | 3 refills | Status: AC

## 2024-12-19 NOTE — Progress Notes (Unsigned)
 "       Established patient visit   History of Present Illness   Discussed the use of AI scribe software for clinical note transcription with the patient, who gave verbal consent to proceed.  History of Present Illness   Tammy Graves is a 59 year old female with hypertension who presents with concerns about blood pressure management and recent episodes of vomiting.  Projectile vomiting and electrolyte abnormalities - Sudden episode of projectile vomiting without associated fever or additional symptoms - Laboratory evaluation revealed hyponatremia (sodium 121 mmol/L) and hypokalemia (potassium 2.9 mmol/L) at the time of vomiting - Treated with oral potassium chloride , resulting in normalization of potassium levels by discharge  Hypertension management - History of hypertension managed with valsartan  and previously hydrochlorothiazide  - Hydrochlorothiazide  discontinued due to hypokalemia - Currently takes valsartan  alone each morning - Home blood pressure monitoring reveals variable readings  Dietary sodium intake concerns - Avoids salt in her diet - Expresses concern regarding the impact of low dietary sodium on her serum sodium levels  Hyperlipidemia and weight management - Takes Lipitor in the evening for hyperlipidemia - Actively working on weight loss - Recent home weight measurement: 161 lbs      Physical Exam   Physical Exam Vitals reviewed.  Constitutional:      General: She is not in acute distress.    Appearance: Normal appearance.  HENT:     Head: Normocephalic and atraumatic.  Cardiovascular:     Rate and Rhythm: Normal rate and regular rhythm.     Pulses: Normal pulses.     Heart sounds: Normal heart sounds. No murmur heard.    No friction rub. No gallop.  Pulmonary:     Effort: Pulmonary effort is normal. No respiratory distress.     Breath sounds: Normal breath sounds. No wheezing.  Skin:    General: Skin is warm and dry.  Neurological:     Mental  Status: She is alert and oriented to person, place, and time.  Psychiatric:        Mood and Affect: Mood normal.        Behavior: Behavior normal.        Thought Content: Thought content normal.        Judgment: Judgment normal.    Assessment & Plan   Problem List Items Addressed This Visit       Cardiovascular and Mediastinum   Essential hypertension   Relevant Medications   amLODipine  (NORVASC ) 5 MG tablet   Other Visit Diagnoses       Hospital discharge follow-up    -  Primary   Relevant Orders   Basic Metabolic Panel (BMET)     Hypokalemia       Relevant Orders   Basic Metabolic Panel (BMET)     Hyponatremia       Relevant Orders   Basic Metabolic Panel (BMET)      Assessment and Plan    Essential hypertension Hypertension uncontrolled with valsartan . Amlodipine  added for better control and suitability given diuretic intolerance. - Added amlodipine  5 mg daily to valsartan . - Monitor blood pressure at home regularly. - Schedule nurse visit in two weeks for blood pressure check.  Hyponatremia and hypokalemia Hyponatremia and hypokalemia likely due to HCTZ, now discontinued. Potassium normalized with supplementation. Sodium requires monitoring. - Rechecked sodium and potassium levels. - Continue potassium chloride  supplementation as needed.  General health maintenance Due for flu and pneumonia vaccinations. Discussed vaccination importance. - Plan for pneumonia vaccine in  the near future.        Follow up   Return in about 2 weeks (around 01/02/2025) for nurse visit for BP check. __________________________________ Zada FREDRIK Palin, DNP, APRN, FNP-BC Primary Care and Sports Medicine [facility] [place] "

## 2024-12-20 ENCOUNTER — Ambulatory Visit: Payer: Self-pay | Admitting: Medical-Surgical

## 2024-12-20 DIAGNOSIS — E876 Hypokalemia: Secondary | ICD-10-CM

## 2024-12-20 DIAGNOSIS — E871 Hypo-osmolality and hyponatremia: Secondary | ICD-10-CM

## 2024-12-20 LAB — BASIC METABOLIC PANEL WITH GFR
BUN/Creatinine Ratio: 19 (ref 12–28)
BUN: 13 mg/dL (ref 8–27)
CO2: 19 mmol/L — ABNORMAL LOW (ref 20–29)
Calcium: 9.7 mg/dL (ref 8.7–10.3)
Chloride: 106 mmol/L (ref 96–106)
Creatinine, Ser: 0.69 mg/dL (ref 0.57–1.00)
Glucose: 73 mg/dL (ref 70–99)
Potassium: 3.9 mmol/L (ref 3.5–5.2)
Sodium: 142 mmol/L (ref 134–144)
eGFR: 99 mL/min/1.73

## 2024-12-20 MED ORDER — VALSARTAN 320 MG PO TABS: 320.0000 mg | ORAL_TABLET | Freq: Every day | ORAL | 3 refills | Status: DC

## 2025-01-01 ENCOUNTER — Other Ambulatory Visit: Payer: Self-pay | Admitting: Medical-Surgical

## 2025-01-01 NOTE — Telephone Encounter (Signed)
 Spoke with pharmacy and was told that this is an automated refill request .  Potassium was last written 11/20/2024 as 90 day supply Last OV 12/19/2024 Upcoming appt = none

## 2025-01-09 MED ORDER — VALSARTAN-HYDROCHLOROTHIAZIDE 320-25 MG PO TABS: 1.0000 | ORAL_TABLET | Freq: Every day | ORAL | 3 refills | Status: AC

## 2025-01-09 NOTE — Addendum Note (Signed)
 Addended byBETHA WILLO MINI on: 01/09/2025 05:14 PM   Modules accepted: Orders
